# Patient Record
Sex: Male | Born: 1990 | Race: Black or African American | Hispanic: No | Smoking: Current every day smoker
Health system: Southern US, Community
[De-identification: ages and names within clinical notes are randomized; demographics above are authoritative.]

## PROBLEM LIST (undated history)

## (undated) HISTORY — PX: TOE SURGERY: SHX1073

---
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
## 2009-11-13 ENCOUNTER — Emergency Department (HOSPITAL_COMMUNITY): Admission: EM | Admit: 2009-11-13 | Discharge: 2009-11-13 | Payer: Self-pay | Admitting: Emergency Medicine

## 2010-12-14 LAB — GC/CHLAMYDIA PROBE AMP, GENITAL
Chlamydia, DNA Probe: POSITIVE — AB
GC Probe Amp, Genital: NEGATIVE

## 2011-05-24 ENCOUNTER — Encounter (HOSPITAL_COMMUNITY): Payer: Self-pay | Admitting: Emergency Medicine

## 2011-05-24 ENCOUNTER — Emergency Department (INDEPENDENT_AMBULATORY_CARE_PROVIDER_SITE_OTHER)
Admission: EM | Admit: 2011-05-24 | Discharge: 2011-05-24 | Disposition: A | Discharge status: Home / Self Care | Payer: Self-pay | Source: Home / Self Care | Attending: Family Medicine | Admitting: Family Medicine

## 2011-05-24 DIAGNOSIS — Z202 Contact with and (suspected) exposure to infections with a predominantly sexual mode of transmission: Secondary | ICD-10-CM

## 2011-05-24 DIAGNOSIS — Z9189 Other specified personal risk factors, not elsewhere classified: Secondary | ICD-10-CM

## 2011-05-24 NOTE — ED Provider Notes (Signed)
History     CSN: [id number]  Arrival date & time 05/24/11  4098   First MD Initiated Contact with Patient 05/24/11 [phone]      Chief Complaint  Patient presents with  . SEXUALLY TRANSMITTED DISEASE    (Consider location/radiation/quality/duration/timing/severity/associated sxs/prior treatment) HPI Comments: Patient reports having unprotected intercourse over 2 wks ago. States he has noted intermittent tingling ant there penile os. No discharge, no dysuria. No pain. No skin lesions or rash.   The history is provided by the patient.    History reviewed. No pertinent past medical history.  Past Surgical History  Procedure Date  . Toe surgery     History reviewed. No pertinent family history.  History  Substance Use Topics  . Smoking status: Current Everyday Smoker -- 0.5 packs/day  . Smokeless tobacco: Not on file  . Alcohol Use: Yes     occasional      Review of Systems  Constitutional: Negative.   HENT: Negative.   Respiratory: Negative.   Cardiovascular: Negative.   Gastrointestinal: Negative.   Genitourinary: Negative for dysuria, urgency, frequency, discharge, penile swelling and testicular pain.  Skin: Negative.     Allergies  Review of patient's allergies indicates no known allergies.  Home Medications  No current outpatient prescriptions on file.  BP 122/69  Pulse 72  Temp(Src) 98.5 F (36.9 C) (Oral)  Resp 14  SpO2 97%  Physical Exam  Nursing note and vitals reviewed. Constitutional: He appears well-developed and well-nourished.  Cardiovascular: Normal rate.   Pulmonary/Chest: Effort normal and breath sounds normal.  Genitourinary:       Normal male external gent. Circumcised. No pain , no discharge or tenderness. Sample collected.   Skin: Skin is warm and dry.    ED Course  Procedures (including critical care time)   Labs Reviewed  GC/CHLAMYDIA PROBE AMP, GENITAL   No results found.   1. Possible exposure to STD       MDM           Randa Spike, MD 05/24/11 1030

## 2011-05-24 NOTE — ED Notes (Signed)
Pt c/o tingling feeling in genitals X [redacted] weeks along with a constant discomfort. Pt was reluctant to go into further details about situation.

## 2011-05-24 NOTE — Discharge Instructions (Signed)
No intercourse x 10 days. We will contact you with any positive lab results and any new instructions if indicated.

## 2011-05-25 LAB — GC/CHLAMYDIA PROBE AMP, GENITAL
Chlamydia, DNA Probe: NEGATIVE
GC Probe Amp, Genital: NEGATIVE

## 2014-01-15 ENCOUNTER — Encounter (HOSPITAL_BASED_OUTPATIENT_CLINIC_OR_DEPARTMENT_OTHER): Payer: Self-pay | Admitting: *Deleted

## 2014-01-15 ENCOUNTER — Emergency Department (HOSPITAL_BASED_OUTPATIENT_CLINIC_OR_DEPARTMENT_OTHER): Payer: Self-pay

## 2014-01-15 ENCOUNTER — Emergency Department (HOSPITAL_BASED_OUTPATIENT_CLINIC_OR_DEPARTMENT_OTHER)
Admission: EM | Admit: 2014-01-15 | Discharge: 2014-01-15 | Disposition: A | Discharge status: Home / Self Care | Payer: Self-pay | Attending: Emergency Medicine | Admitting: Emergency Medicine

## 2014-01-15 DIAGNOSIS — Z720 Tobacco use: Secondary | ICD-10-CM | POA: Insufficient documentation

## 2014-01-15 DIAGNOSIS — J069 Acute upper respiratory infection, unspecified: Secondary | ICD-10-CM | POA: Insufficient documentation

## 2014-01-15 NOTE — Discharge Instructions (Signed)
YOu can take mucinex as discussed Upper Respiratory Infection, Adult An upper respiratory infection (URI) is also sometimes known as the common cold. The upper respiratory tract includes the nose, sinuses, throat, trachea, and bronchi. Bronchi are the airways leading to the lungs. Most people improve within 1 week, but symptoms can last up to 2 weeks. A residual cough may last even longer.  CAUSES Many different viruses can infect the tissues lining the upper respiratory tract. The tissues become irritated and inflamed and often become very moist. Mucus production is also common. A cold is contagious. You can easily spread the virus to others by oral contact. This includes kissing, sharing a glass, coughing, or sneezing. Touching your mouth or nose and then touching a surface, which is then touched by another person, can also spread the virus. SYMPTOMS  Symptoms typically develop 1 to 3 days after you come in contact with a cold virus. Symptoms vary from person to person. They may include:  Runny nose.  Sneezing.  Nasal congestion.  Sinus irritation.  Sore throat.  Loss of voice (laryngitis).  Cough.  Fatigue.  Muscle aches.  Loss of appetite.  Headache.  Low-grade fever. DIAGNOSIS  You might diagnose your own cold based on familiar symptoms, since most people get a cold 2 to 3 times a year. Your caregiver can confirm this based on your exam. Most importantly, your caregiver can check that your symptoms are not due to another disease such as strep throat, sinusitis, pneumonia, asthma, or epiglottitis. Blood tests, throat tests, and X-rays are not necessary to diagnose a common cold, but they may sometimes be helpful in excluding other more serious diseases. Your caregiver will decide if any further tests are required. RISKS AND COMPLICATIONS  You may be at risk for a more severe case of the common cold if you smoke cigarettes, have chronic heart disease (such as heart failure) or  lung disease (such as asthma), or if you have a weakened immune system. The very young and very old are also at risk for more serious infections. Bacterial sinusitis, middle ear infections, and bacterial pneumonia can complicate the common cold. The common cold can worsen asthma and chronic obstructive pulmonary disease (COPD). Sometimes, these complications can require emergency medical care and may be life-threatening. PREVENTION  The best way to protect against getting a cold is to practice good hygiene. Avoid oral or hand contact with people with cold symptoms. Wash your hands often if contact occurs. There is no clear evidence that vitamin C, vitamin E, echinacea, or exercise reduces the chance of developing a cold. However, it is always recommended to get plenty of rest and practice good nutrition. TREATMENT  Treatment is directed at relieving symptoms. There is no cure. Antibiotics are not effective, because the infection is caused by a virus, not by bacteria. Treatment may include:  Increased fluid intake. Sports drinks offer valuable electrolytes, sugars, and fluids.  Breathing heated mist or steam (vaporizer or shower).  Eating chicken soup or other clear broths, and maintaining good nutrition.  Getting plenty of rest.  Using gargles or lozenges for comfort.  Controlling fevers with ibuprofen or acetaminophen as directed by your caregiver.  Increasing usage of your inhaler if you have asthma. Zinc gel and zinc lozenges, taken in the first 24 hours of the common cold, can shorten the duration and lessen the severity of symptoms. Pain medicines may help with fever, muscle aches, and throat pain. A variety of non-prescription medicines are available to treat  congestion and runny nose. Your caregiver can make recommendations and may suggest nasal or lung inhalers for other symptoms.  HOME CARE INSTRUCTIONS   Only take over-the-counter or prescription medicines for pain, discomfort, or  fever as directed by your caregiver.  Use a warm mist humidifier or inhale steam from a shower to increase air moisture. This may keep secretions moist and make it easier to breathe.  Drink enough water and fluids to keep your urine clear or pale yellow.  Rest as needed.  Return to work when your temperature has returned to normal or as your caregiver advises. You may need to stay home longer to avoid infecting others. You can also use a face mask and careful hand washing to prevent spread of the virus. SEEK MEDICAL CARE IF:   After the first few days, you feel you are getting worse rather than better.  You need your caregiver's advice about medicines to control symptoms.  You develop chills, worsening shortness of breath, or brown or red sputum. These may be signs of pneumonia.  You develop yellow or brown nasal discharge or pain in the face, especially when you bend forward. These may be signs of sinusitis.  You develop a fever, swollen neck glands, pain with swallowing, or white areas in the back of your throat. These may be signs of strep throat. SEEK IMMEDIATE MEDICAL CARE IF:   You have a fever.  You develop severe or persistent headache, ear pain, sinus pain, or chest pain.  You develop wheezing, a prolonged cough, cough up blood, or have a change in your usual mucus (if you have chronic lung disease).  You develop sore muscles or a stiff neck. Document Released: 08/15/2000 Document Revised: 05/14/2011 Document Reviewed: 05/27/2013 [facility] Patient Information 2015 [place], [place]. This information is not intended to replace advice given to you by your health care provider. Make sure you discuss any questions you have with your health care provider.

## 2014-01-15 NOTE — ED Notes (Signed)
Pt c/o chest congestion and pro cough x 2 days

## 2014-01-15 NOTE — ED Provider Notes (Signed)
CSN: [id number]     Arrival date & time 01/15/14  1729 History   First MD Initiated Contact with Patient 01/15/14 1808     Chief Complaint  Patient presents with  . Sore Throat     (Consider location/radiation/quality/duration/timing/severity/associated sxs/prior Treatment) HPI Comments: Pt comes in today with productive cough and congestion times 2 weeks. No fever, vomiting, abdominal pain or dysuria. Hasn't taken anything for symptoms  The history is provided by the patient. No language interpreter was used.    History reviewed. No pertinent past medical history. Past Surgical History  Procedure Laterality Date  . Toe surgery     History reviewed. No pertinent family history. History  Substance Use Topics  . Smoking status: Current Every Day Smoker -- 0.50 packs/day  . Smokeless tobacco: Not on file  . Alcohol Use: Yes     Comment: occasional    Review of Systems  All other systems reviewed and are negative.     Allergies  Review of patient's allergies indicates no known allergies.  Home Medications   Prior to Admission medications   Not on File   BP 137/76 mmHg  Pulse 76  Temp(Src) 98.1 F (36.7 C) (Oral)  Resp 18  Ht 5\' 11"  (1.803 m)  Wt 200 lb (90.719 kg)  BMI 27.91 kg/m2  SpO2 99% Physical Exam  Constitutional: He is oriented to person, place, and time. He appears well-developed and well-nourished.  HENT:  Right Ear: External ear normal.  Left Ear: External ear normal.  Nose: Rhinorrhea present.  Mouth/Throat: No posterior oropharyngeal erythema.  Eyes: Conjunctivae and EOM are normal. Pupils are equal, round, and reactive to light.  Neck: Normal range of motion. Neck supple.  Cardiovascular: Normal rate and regular rhythm.   Pulmonary/Chest: Effort normal and breath sounds normal.  Abdominal: Soft. There is no tenderness.  Musculoskeletal: Normal range of motion.  Neurological: He is alert and oriented to person, place, and time.  Nursing note  and vitals reviewed.   ED Course  Procedures (including critical care time) Labs Review Labs Reviewed - No data to display  Imaging Review No results found.   EKG Interpretation None      MDM   Final diagnoses:  URI (upper respiratory infection)    Pt lungs clear:don't think imaging or antibiotics are needed at this time. Discussed use of otc medications with pt   Teressa LowerVrinda Ashleyanne Hemmingway, NP 01/15/14 1826  Warnell Foresterrey Wofford, MD 01/15/14 [id number]

## 2014-01-25 ENCOUNTER — Emergency Department (HOSPITAL_BASED_OUTPATIENT_CLINIC_OR_DEPARTMENT_OTHER)
Admission: EM | Admit: 2014-01-25 | Discharge: 2014-01-25 | Disposition: A | Discharge status: Home / Self Care | Payer: Self-pay | Attending: Emergency Medicine | Admitting: Emergency Medicine

## 2014-01-25 ENCOUNTER — Encounter (HOSPITAL_BASED_OUTPATIENT_CLINIC_OR_DEPARTMENT_OTHER): Payer: Self-pay | Admitting: *Deleted

## 2014-01-25 ENCOUNTER — Emergency Department (HOSPITAL_BASED_OUTPATIENT_CLINIC_OR_DEPARTMENT_OTHER): Payer: Self-pay

## 2014-01-25 DIAGNOSIS — Y929 Unspecified place or not applicable: Secondary | ICD-10-CM | POA: Insufficient documentation

## 2014-01-25 DIAGNOSIS — T1490XA Injury, unspecified, initial encounter: Secondary | ICD-10-CM

## 2014-01-25 DIAGNOSIS — M79642 Pain in left hand: Secondary | ICD-10-CM

## 2014-01-25 DIAGNOSIS — S6992XA Unspecified injury of left wrist, hand and finger(s), initial encounter: Secondary | ICD-10-CM | POA: Insufficient documentation

## 2014-01-25 DIAGNOSIS — Z23 Encounter for immunization: Secondary | ICD-10-CM | POA: Insufficient documentation

## 2014-01-25 DIAGNOSIS — W2201XA Walked into wall, initial encounter: Secondary | ICD-10-CM | POA: Insufficient documentation

## 2014-01-25 DIAGNOSIS — Y998 Other external cause status: Secondary | ICD-10-CM | POA: Insufficient documentation

## 2014-01-25 DIAGNOSIS — Z720 Tobacco use: Secondary | ICD-10-CM | POA: Insufficient documentation

## 2014-01-25 DIAGNOSIS — Y939 Activity, unspecified: Secondary | ICD-10-CM | POA: Insufficient documentation

## 2014-01-25 MED ORDER — NAPROXEN 500 MG PO TABS: 500.0000 mg | ORAL_TABLET | Freq: Two times a day (BID) | ORAL | Status: DC | PRN

## 2014-01-25 MED ORDER — TETANUS-DIPHTH-ACELL PERTUSSIS 5-2.5-18.5 LF-MCG/0.5 IM SUSP
0.5000 mL | Freq: Once | INTRAMUSCULAR | Status: AC
  Administered 2014-01-25: 0.5 mL via INTRAMUSCULAR
  Filled 2014-01-25: qty 0.5

## 2014-01-25 MED ORDER — HYDROCODONE-ACETAMINOPHEN 5-325 MG PO TABS
1.0000 | ORAL_TABLET | Freq: Once | ORAL | Status: AC
  Administered 2014-01-25: 1 via ORAL
  Filled 2014-01-25: qty 1

## 2014-01-25 MED ORDER — HYDROCODONE-ACETAMINOPHEN 5-325 MG PO TABS: 1.0000 | ORAL_TABLET | Freq: Four times a day (QID) | ORAL | Status: DC | PRN

## 2014-01-25 MED ORDER — BACITRACIN 500 UNIT/GM EX OINT
1.0000 "application " | TOPICAL_OINTMENT | Freq: Two times a day (BID) | CUTANEOUS | Status: DC
  Filled 2014-01-25: qty 0.9

## 2014-01-25 NOTE — Discharge Instructions (Signed)
Please follow up with your primary care physician in 1-2 days. If you do not have one please call the [facility] listed above. Please follow up with Dr. Pearletha ForgeHudnall to schedule a follow up appointment.  Please take pain medication and/or muscle relaxants as prescribed and as needed for pain. Please do not drive on narcotic pain medication or on muscle relaxants. Please follow RICE method below. Please read all discharge instructions and return precautions.   Hand Contusion A hand contusion is a deep bruise on your hand area. Contusions are the result of an injury that caused bleeding under the skin. The contusion may turn blue, purple, or yellow. Minor injuries will give you a painless contusion, but more severe contusions may stay painful and swollen for a few weeks. CAUSES  A contusion is usually caused by a blow, trauma, or direct force to an area of the body. SYMPTOMS   Swelling and redness of the injured area.  Discoloration of the injured area.  Tenderness and soreness of the injured area.  Pain. DIAGNOSIS  The diagnosis can be made by taking a history and performing a physical exam. An X-ray, CT scan, or MRI may be needed to determine if there were any associated injuries, such as broken bones (fractures). TREATMENT  Often, the best treatment for a hand contusion is resting, elevating, icing, and applying cold compresses to the injured area. Over-the-counter medicines may also be recommended for pain control. HOME CARE INSTRUCTIONS   Put ice on the injured area.  Put ice in a plastic bag.  Place a towel between your skin and the bag.  Leave the ice on for 15-20 minutes, 03-04 times a day.  Only take over-the-counter or prescription medicines as directed by your caregiver. Your caregiver may recommend avoiding anti-inflammatory medicines (aspirin, ibuprofen, and naproxen) for 48 hours because these medicines may increase bruising.  If told, use an elastic  wrap as directed. This can help reduce swelling. You may remove the wrap for sleeping, showering, and bathing. If your fingers become numb, cold, or blue, take the wrap off and reapply it more loosely.  Elevate your hand with pillows to reduce swelling.  Avoid overusing your hand if it is painful. SEEK IMMEDIATE MEDICAL CARE IF:   You have increased redness, swelling, or pain in your hand.  Your swelling or pain is not relieved with medicines.  You have loss of feeling in your hand or are unable to move your fingers.  Your hand turns cold or blue.  You have pain when you move your fingers.  Your hand becomes warm to the touch.  Your contusion does not improve in 2 days. MAKE SURE YOU:   Understand these instructions.  Will watch your condition.  Will get help right away if you are not doing well or get worse. Document Released: 08/11/2001 Document Revised: 11/14/2011 Document Reviewed: 08/13/2011 [facility] Patient Information 2015 [place], [place]. This information is not intended to replace advice given to you by your health care provider. Make sure you discuss any questions you have with your health care provider.  RICE: Routine Care for Injuries The routine care of many injuries includes Rest, Ice, Compression, and Elevation (RICE). HOME CARE INSTRUCTIONS  Rest is needed to allow your body to heal. Routine activities can usually be resumed when comfortable. Injured tendons and bones can take up to 6 weeks to heal. Tendons are the cord-like structures that attach muscle to bone.  Ice following an injury helps  keep the swelling down and reduces pain.  Put ice in a plastic bag.  Place a towel between your skin and the bag.  Leave the ice on for 15-20 minutes, 3-4 times a day, or as directed by your health care provider. Do this while awake, for the first 24 to 48 hours. After that, continue as directed by your caregiver.  Compression helps keep swelling down. It also gives  support and helps with discomfort. If an elastic bandage has been applied, it should be removed and reapplied every 3 to 4 hours. It should not be applied tightly, but firmly enough to keep swelling down. Watch fingers or toes for swelling, bluish discoloration, coldness, numbness, or excessive pain. If any of these problems occur, remove the bandage and reapply loosely. Contact your caregiver if these problems continue.  Elevation helps reduce swelling and decreases pain. With extremities, such as the arms, hands, legs, and feet, the injured area should be placed near or above the level of the heart, if possible. SEEK IMMEDIATE MEDICAL CARE IF:  You have persistent pain and swelling.  You develop redness, numbness, or unexpected weakness.  Your symptoms are getting worse rather than improving after several days. These symptoms may indicate that further evaluation or further X-rays are needed. Sometimes, X-rays may not show a small broken bone (fracture) until 1 week or 10 days later. Make a follow-up appointment with your caregiver. Ask when your X-ray results will be ready. Make sure you get your X-ray results. Document Released: 06/03/2000 Document Revised: 02/24/2013 Document Reviewed: 07/21/2010 [facility] CenterExitCare Patient Information 2015 [place]ExitCare, [place]LL[place]. This information is not intended to replace advice given to you by your health care provider. Make sure you discuss any questions you have with your health care provider.

## 2014-01-25 NOTE — ED Notes (Signed)
Pt discharged to home with family. NAD.  

## 2014-01-25 NOTE — ED Provider Notes (Signed)
CSN: [id number]     Arrival date & time 01/25/14  1613 History   First MD Initiated Contact with Patient 01/25/14 1635     Chief Complaint  Patient presents with  . Hand Injury     (Consider location/radiation/quality/duration/timing/severity/associated sxs/prior Treatment) HPI Comments: Patient is an 23 year old male past medical history significant for tobacco abuse presenting to the emergency department for left hand pain. He states last evening he hit his hand on the wall. This complaining of moderate sore achy pain to the hand without radiation. Alleviating factors: ice. Aggravating factors: movement, palpation. Medications tried prior to arrival: none. No history of left hand injuries in the past. Denies any numbness or tingling. He is right-hand dominant.    Patient is a 23 y.o. male presenting with hand injury.  Hand Injury   History reviewed. No pertinent past medical history. Past Surgical History  Procedure Laterality Date  . Toe surgery     No family history on file. History  Substance Use Topics  . Smoking status: Current Every Day Smoker -- 0.50 packs/day  . Smokeless tobacco: Not on file  . Alcohol Use: Yes     Comment: occasional    Review of Systems  Musculoskeletal: Positive for myalgias and arthralgias.  Skin: Positive for wound.  All other systems reviewed and are negative.     Allergies  Review of patient's allergies indicates no known allergies.  Home Medications   Prior to Admission medications   Not on File   BP 146/66 mmHg  Pulse 94  Temp(Src) 98.3 F (36.8 C) (Oral)  Resp 16  Ht 5\' 11"  (1.803 m)  Wt 200 lb (90.719 kg)  BMI 27.91 kg/m2  SpO2 100% Physical Exam  Constitutional: He is oriented to person, place, and time. He appears well-developed and well-nourished. No distress.  HENT:  Head: Normocephalic and atraumatic.  Right Ear: External ear normal.  Left Ear: External ear normal.  Nose: Nose normal.  Mouth/Throat:  Oropharynx is clear and moist.  Eyes: Conjunctivae are normal.  Neck: Normal range of motion. Neck supple.  Cardiovascular: Normal rate, regular rhythm, normal heart sounds and intact distal pulses.   Pulmonary/Chest: Effort normal and breath sounds normal.  Abdominal: Soft.  Musculoskeletal: Normal range of motion.       Right wrist: Normal.       Left wrist: Normal.       Right forearm: Normal.       Right hand: He exhibits tenderness. He exhibits normal range of motion, no bony tenderness, normal capillary refill, no deformity and no swelling. Normal sensation noted. Normal strength noted.       Left hand: Normal.       Hands: Neurological: He is alert and oriented to person, place, and time.  Skin: Skin is warm and dry. He is not diaphoretic.  Psychiatric: He has a normal mood and affect.  Nursing note and vitals reviewed.   ED Course  Procedures (including critical care time) Medications  bacitracin ointment 1 application (not administered)  Tdap (BOOSTRIX) injection 0.5 mL (0.5 mLs Intramuscular Given 01/25/14 1720)  HYDROcodone-acetaminophen (NORCO/VICODIN) 5-325 MG per tablet 1 tablet (1 tablet Oral Given 01/25/14 1723)    Labs Review Labs Reviewed - No data to display  Imaging Review Dg Hand Complete Left  01/25/2014   CLINICAL DATA:  Hit something last night with his LEFT hand, posterior pain since, blunt trauma, initial encounter  EXAM: LEFT HAND - COMPLETE 3+ VIEW  COMPARISON:  None  FINDINGS: Osseous mineralization normal.  Joint spaces preserved.  No fracture, dislocation, or bone destruction.  IMPRESSION: Normal exam.   Electronically Signed   By: Ulyses SouthwardMark  Boles M.D.   On: 01/25/2014 16:34     EKG Interpretation None      MDM   Final diagnoses:  Left hand pain    Filed Vitals:   01/25/14 1618  BP: 146/66  Pulse: 94  Temp: 98.3 F (36.8 C)  Resp: 16   Afebrile, NAD, non-toxic appearing, AAOx4.  Neurovascularly intact. Normal sensation. No evidence of  compartment syndrome. Patient X-Ray negative for obvious fracture or dislocation. Tdap booster updated given abrasions. Pain managed in ED. Pt advised to follow up with orthopedics if symptoms persist for possibility of missed fracture diagnosis. Conservative therapy recommended and discussed. Patient will be dc home & is agreeable with above plan. Patient is stable at time of discharge      Jeannetta EllisJennifer L Desman Polak, PA-C 01/25/14 1846  Doug SouSam Jacubowitz, MD 01/25/14 2223

## 2014-01-25 NOTE — ED Notes (Signed)
Left hand injury last night. He hit something. Swelling noted.

## 2014-01-31 ENCOUNTER — Encounter (HOSPITAL_COMMUNITY): Payer: Self-pay | Admitting: Emergency Medicine

## 2014-01-31 ENCOUNTER — Emergency Department (HOSPITAL_COMMUNITY)
Admission: EM | Admit: 2014-01-31 | Discharge: 2014-02-01 | Disposition: A | Discharge status: Home / Self Care | Payer: Self-pay | Attending: Emergency Medicine | Admitting: Emergency Medicine

## 2014-01-31 DIAGNOSIS — F430 Acute stress reaction: Secondary | ICD-10-CM

## 2014-01-31 DIAGNOSIS — Z720 Tobacco use: Secondary | ICD-10-CM | POA: Insufficient documentation

## 2014-01-31 DIAGNOSIS — F4325 Adjustment disorder with mixed disturbance of emotions and conduct: Secondary | ICD-10-CM | POA: Insufficient documentation

## 2014-01-31 DIAGNOSIS — R454 Irritability and anger: Secondary | ICD-10-CM

## 2014-01-31 DIAGNOSIS — F1210 Cannabis abuse, uncomplicated: Secondary | ICD-10-CM | POA: Insufficient documentation

## 2014-01-31 LAB — CBC WITH DIFFERENTIAL/PLATELET
Basophils Absolute: 0 10*3/uL (ref 0.0–0.1)
Basophils Relative: 0 % (ref 0–1)
Eosinophils Absolute: 0.1 10*3/uL (ref 0.0–0.7)
Eosinophils Relative: 2 % (ref 0–5)
HCT: 41.5 % (ref 39.0–52.0)
Hemoglobin: 14.4 g/dL (ref 13.0–17.0)
Lymphocytes Relative: 22 % (ref 12–46)
Lymphs Abs: 2.1 10*3/uL (ref 0.7–4.0)
MCH: 28.1 pg (ref 26.0–34.0)
MCHC: 34.7 g/dL (ref 30.0–36.0)
MCV: 81.1 fL (ref 78.0–100.0)
Monocytes Absolute: 0.7 10*3/uL (ref 0.1–1.0)
Monocytes Relative: 7 % (ref 3–12)
Neutro Abs: 6.7 10*3/uL (ref 1.7–7.7)
Neutrophils Relative %: 69 % (ref 43–77)
Platelets: 228 10*3/uL (ref 150–400)
RBC: 5.12 MIL/uL (ref 4.22–5.81)
RDW: 14.7 % (ref 11.5–15.5)
WBC: 9.7 10*3/uL (ref 4.0–10.5)

## 2014-01-31 LAB — COMPREHENSIVE METABOLIC PANEL
ALT: 19 U/L (ref 0–53)
AST: 21 U/L (ref 0–37)
Albumin: 4.4 g/dL (ref 3.5–5.2)
Alkaline Phosphatase: 51 U/L (ref 39–117)
Anion gap: 17 — ABNORMAL HIGH (ref 5–15)
BUN: 13 mg/dL (ref 6–23)
CO2: 21 mEq/L (ref 19–32)
Calcium: 9.3 mg/dL (ref 8.4–10.5)
Chloride: 106 mEq/L (ref 96–112)
Creatinine, Ser: 1.03 mg/dL (ref 0.50–1.35)
GFR calc Af Amer: >90 mL/min (ref >90)
GFR calc non Af Amer: >90 mL/min (ref >90)
Glucose, Bld: 106 mg/dL — ABNORMAL HIGH (ref 70–99)
Potassium: 4 mEq/L (ref 3.7–5.3)
Sodium: 144 mEq/L (ref 137–147)
Total Bilirubin: 0.5 mg/dL (ref 0.3–1.2)
Total Protein: 7.8 g/dL (ref 6.0–8.3)

## 2014-01-31 LAB — RAPID URINE DRUG SCREEN, HOSP PERFORMED
Amphetamines: NOT DETECTED
Barbiturates: NOT DETECTED
Benzodiazepines: NOT DETECTED
Cocaine: NOT DETECTED
Opiates: NOT DETECTED
Tetrahydrocannabinol: POSITIVE — AB

## 2014-01-31 LAB — SALICYLATE LEVEL: Salicylate Lvl: <2 mg/dL — ABNORMAL LOW (ref 2.8–20.0)

## 2014-01-31 LAB — ETHANOL: Alcohol, Ethyl (B): 109 mg/dL — ABNORMAL HIGH (ref 0–11)

## 2014-01-31 LAB — ACETAMINOPHEN LEVEL: Acetaminophen (Tylenol), Serum: <15 ug/mL (ref 10–30)

## 2014-01-31 MED ORDER — ACETAMINOPHEN 325 MG PO TABS: 650.0000 mg | ORAL_TABLET | ORAL | Status: DC | PRN

## 2014-01-31 MED ORDER — LORAZEPAM 1 MG PO TABS
1.0000 mg | ORAL_TABLET | Freq: Three times a day (TID) | ORAL | Status: DC | PRN
  Administered 2014-01-31: 1 mg via ORAL
  Filled 2014-01-31: qty 1

## 2014-01-31 MED ORDER — ONDANSETRON HCL 4 MG PO TABS: 4.0000 mg | ORAL_TABLET | Freq: Three times a day (TID) | ORAL | Status: DC | PRN

## 2014-01-31 NOTE — ED Provider Notes (Addendum)
CSN: [id number]     Arrival date & time 01/31/14  1005 History   First MD Initiated Contact with Patient 01/31/14 1026     Chief Complaint  Patient presents with  . Medical Clearance     (Consider location/radiation/quality/duration/timing/severity/associated sxs/prior Treatment) Patient is a 23 y.o. male presenting with mental health disorder. The history is provided by the patient. No language interpreter was used.  Mental Health Problem Presenting symptoms: aggressive behavior, agitation, homicidal ideas and suicidal thoughts   Patient accompanied by:  Friend Degree of incapacity (severity):  Moderate Onset quality:  Gradual Timing:  Constant Progression:  Worsening Chronicity:  New Context: drug abuse   Treatment compliance:  Untreated Relieved by:  Nothing Worsened by:  Nothing tried Ineffective treatments:  None tried Risk factors: no family hx of mental illness   Pt reports she wakes up angry every day.  Pt reports he wants to hurt someone.  Pt admits to thoughts of self harm.  No plan.Pt thinks there must be something wrong with him   History reviewed. No pertinent past medical history. Past Surgical History  Procedure Laterality Date  . Toe surgery     No family history on file. History  Substance Use Topics  . Smoking status: Current Every Day Smoker -- 0.50 packs/day  . Smokeless tobacco: Not on file  . Alcohol Use: Yes     Comment: occasional    Review of Systems  Psychiatric/Behavioral: Positive for suicidal ideas, homicidal ideas and agitation.  All other systems reviewed and are negative.     Allergies  Review of patient's allergies indicates no known allergies.  Home Medications   Prior to Admission medications   Medication Sig Start Date End Date Taking? Authorizing Provider  HYDROcodone-acetaminophen (NORCO/VICODIN) 5-325 MG per tablet Take 1-2 tablets by mouth every 6 (six) hours as needed for severe pain. Patient taking differently: Take 1  tablet by mouth every 6 (six) hours as needed for severe pain.  01/25/14  Yes Jennifer L Piepenbrink, PA-C  naproxen (NAPROSYN) 500 MG tablet Take 1 tablet (500 mg total) by mouth 2 (two) times daily as needed for mild pain or moderate pain. 01/25/14  Yes Jennifer L Piepenbrink, PA-C  Phenylephrine-DM-GG-APAP (MUCINEX FAST-MAX) 5-10-200-325 MG CAPS Take 1 capsule by mouth every 6 (six) hours as needed (for cold).   Yes Historical Provider, MD  tetanus immune globulin (HYPERTET) 250 UNIT/ML injection Inject 250 Units into the muscle once.   Yes Historical Provider, MD   BP 142/92 mmHg  Pulse 82  Temp(Src) 98.1 F (36.7 C) (Oral)  Resp 20  SpO2 100% Physical Exam  Constitutional: He is oriented to person, place, and time. He appears well-developed and well-nourished.  HENT:  Head: Normocephalic and atraumatic.  Right Ear: External ear normal.  Left Ear: External ear normal.  Eyes: Conjunctivae and EOM are normal. Pupils are equal, round, and reactive to light.  Neck: Normal range of motion.  Cardiovascular: Normal rate, regular rhythm and normal heart sounds.   Pulmonary/Chest: Effort normal.  Abdominal: Soft. He exhibits no distension.  Musculoskeletal: Normal range of motion.  Neurological: He is alert and oriented to person, place, and time.  Skin: Skin is warm.  Psychiatric: He has a normal mood and affect.  Nursing note and vitals reviewed.   ED Course  Procedures (including critical care time) Labs Review Labs Reviewed  COMPREHENSIVE METABOLIC PANEL - Abnormal; Notable for the following:    Glucose, Bld 106 (*)    Anion gap 17 (*)  All other components within normal limits  ETHANOL - Abnormal; Notable for the following:    Alcohol, Ethyl (B) 109 (*)    All other components within normal limits  SALICYLATE LEVEL - Abnormal; Notable for the following:    Salicylate Lvl <2.0 (*)    All other components within normal limits  CBC WITH DIFFERENTIAL  ACETAMINOPHEN LEVEL   URINE RAPID DRUG SCREEN (HOSP PERFORMED)    Imaging Review No results found.   EKG Interpretation None      Results for orders placed or performed during the hospital encounter of 01/31/14  CBC WITH DIFFERENTIAL  Result Value Ref Range   WBC 9.7 4.0 - 10.5 K/uL   RBC 5.12 4.22 - 5.81 MIL/uL   Hemoglobin 14.4 13.0 - 17.0 g/dL   HCT 57.8 46.9 - 62.9 %   MCV 81.1 78.0 - 100.0 fL   MCH 28.1 26.0 - 34.0 pg   MCHC 34.7 30.0 - 36.0 g/dL   RDW 52.8 41.3 - 24.4 %   Platelets 228 150 - 400 K/uL   Neutrophils Relative % 69 43 - 77 %   Neutro Abs 6.7 1.7 - 7.7 K/uL   Lymphocytes Relative 22 12 - 46 %   Lymphs Abs 2.1 0.7 - 4.0 K/uL   Monocytes Relative 7 3 - 12 %   Monocytes Absolute 0.7 0.1 - 1.0 K/uL   Eosinophils Relative 2 0 - 5 %   Eosinophils Absolute 0.1 0.0 - 0.7 K/uL   Basophils Relative 0 0 - 1 %   Basophils Absolute 0.0 0.0 - 0.1 K/uL  Comprehensive metabolic panel  Result Value Ref Range   Sodium 144 137 - 147 mEq/L   Potassium 4.0 3.7 - 5.3 mEq/L   Chloride 106 96 - 112 mEq/L   CO2 21 19 - 32 mEq/L   Glucose, Bld 106 (H) 70 - 99 mg/dL   BUN 13 6 - 23 mg/dL   Creatinine, Ser 0.10 0.50 - 1.35 mg/dL   Calcium 9.3 8.4 - 27.2 mg/dL   Total Protein 7.8 6.0 - 8.3 g/dL   Albumin 4.4 3.5 - 5.2 g/dL   AST 21 0 - 37 U/L   ALT 19 0 - 53 U/L   Alkaline Phosphatase 51 39 - 117 U/L   Total Bilirubin 0.5 0.3 - 1.2 mg/dL   GFR calc non Af Amer >90 >90 mL/min   GFR calc Af Amer >90 >90 mL/min   Anion gap 17 (H) 5 - 15  Drug screen panel, emergency  Result Value Ref Range   Opiates NONE DETECTED NONE DETECTED   Cocaine NONE DETECTED NONE DETECTED   Benzodiazepines NONE DETECTED NONE DETECTED   Amphetamines NONE DETECTED NONE DETECTED   Tetrahydrocannabinol POSITIVE (A) NONE DETECTED   Barbiturates NONE DETECTED NONE DETECTED  Ethanol  Result Value Ref Range   Alcohol, Ethyl (B) 109 (H) 0 - 11 mg/dL  Acetaminophen level  Result Value Ref Range   Acetaminophen (Tylenol),  Serum <15.0 10 - 30 ug/mL  Salicylate level  Result Value Ref Range   Salicylate Lvl <2.0 (L) 2.8 - 20.0 mg/dL   Dg Hand Complete Left  01/25/2014   CLINICAL DATA:  Hit something last night with his LEFT hand, posterior pain since, blunt trauma, initial encounter  EXAM: LEFT HAND - COMPLETE 3+ VIEW  COMPARISON:  None  FINDINGS: Osseous mineralization normal.  Joint spaces preserved.  No fracture, dislocation, or bone destruction.  IMPRESSION: Normal exam.   Electronically Signed  By: Ulyses SouthwardMark  Boles M.D.   On: 01/25/2014 16:34     MDM   Final diagnoses:  Difficulty controlling anger  Depression    Pt moved to psch hold unit    Elson AreasLeslie K Sofia, PA-C 01/31/14 1206  Nelia Shiobert L Beaton, MD 01/31/14 [address]1515  Leslie K HollygroveSofia, PA-C 01/31/14 1542  Nelia Shiobert L Beaton, MD 02/01/14 [phone]

## 2014-01-31 NOTE — ED Notes (Addendum)
Pt from home c/o waking up every morning angry and " wanting to hurt someone". Patient states "I don't know sometimes" when asked if wants to hurt himself. Pt tearful during assessment. Pt has  Abrasions to knuckles from punching stuff.

## 2014-01-31 NOTE — BH Assessment (Signed)
[facility] Assessment Progress Note  Spoke with Clydie BraunKaren, [place] and took history.  Rn in [place] will put machine in the room for tele assessment.

## 2014-01-31 NOTE — ED Notes (Signed)
Jasmine [phone]  Work is [phone]

## 2014-01-31 NOTE — ED Notes (Signed)
Pt states he can not void at this time.  

## 2014-01-31 NOTE — ED Notes (Signed)
Nursing admission note: pt came to this institution voluntarily stating that he has intermittent HI. He stated he wakes up angry and during one of his episode he hit a wall and both of his hands are swollen and they have abrasions. During the nursing assessment he began to cry.  He denies any SI and stated that he has some auditory hallucinations. He was unclear of the details of these auditory hallucinations. He has no medical problem and denied any pain presently. He stated he smokes mj and cigarettes, but did not want a nicotine patch for cigarette smoking. He drinks etoh sometimes and stated he had 4 to 5 shots of "moonshine" last night and smoked mj. He is in a relationship with a woman and stated he is happy with her. He has never been in a psych hospital before. He denies any physical, sexual or emotional abuse in his life. He denied any allergies and has an 11 th grade education.RN will monitor and Q 15 min ck's continue.
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
## 2014-01-31 NOTE — ED Notes (Signed)
Report received from Brooks RN. Pt. Alert and oriented in no distress denies SI, HI, AVH and pain. Will continue to monitor for safety. Pt. Instructed to come to me with problems or concerns. Q 15 minute checks continue. 

## 2014-02-01 DIAGNOSIS — F430 Acute stress reaction: Secondary | ICD-10-CM | POA: Insufficient documentation

## 2014-02-01 DIAGNOSIS — R454 Irritability and anger: Secondary | ICD-10-CM | POA: Insufficient documentation
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
## 2014-02-01 NOTE — BHH Suicide Risk Assessment (Signed)
Suicide Risk Assessment  Discharge Assessment     Demographic Factors:  Male and Adolescent or young adult  Total Time spent with patient: 45 minutes  Psychiatric Specialty Exam:     Blood pressure 146/93, pulse 72, temperature 98.4 F (36.9 C), temperature source Oral, resp. rate 12, SpO2 100 %.There is no weight on file to calculate BMI.  General Appearance: Casual  Eye Contact::  Good  Speech:  Normal Rate  Volume:  Normal  Mood:  Euthymic  Affect:  Congruent  Thought Process:  Coherent  Orientation:  Full (Time, Place, and Person)  Thought Content:  WDL  Suicidal Thoughts:  No  Homicidal Thoughts:  No  Memory:  Good  Judgement:  Fair  Insight:  Good  Psychomotor Activity:  Normal  Concentration:  Good  Recall:  Good  Fund of Knowledge:Good  Language: Good  Akathisia:  No  Handed:  Right  AIMS (if indicated):     Assets:  Housing Intimacy Leisure Time Physical Health Resilience Social Support  Sleep:       Musculoskeletal: Strength & Muscle Tone: within normal limits Gait & Station: normal Patient leans: N/A   Mental Status Per Nursing Assessment::   On Admission:   anger   Current Mental Status by Physician: NA  Loss Factors: NA  Historical Factors: NA  Risk Reduction Factors:   Sense of responsibility to family, Living with another person, especially a relative and Positive social support  Continued Clinical Symptoms:  None  Cognitive Features That Contribute To Risk:  None   Suicide Risk:  Minimal: No identifiable suicidal ideation.  Patients presenting with no risk factors but with morbid ruminations; may be classified as minimal risk based on the severity of the depressive symptoms  Discharge Diagnoses:   AXIS I:  Acute stress reaction causing mixed disturbance of emotion and conduct; difficulty controlling his anger AXIS II:  Deferred AXIS III:  History reviewed. No pertinent past medical history. AXIS IV:  other psychosocial or  environmental problems and problems related to social environment AXIS V:  61-70 mild symptoms  Plan Of Care/Follow-up recommendations:  Activity:  as tolerated Diet:  heart healthy diet  Is patient on multiple antipsychotic therapies at discharge:  No   Has Patient had three or more failed trials of antipsychotic monotherapy by history:  No  Recommended Plan for Multiple Antipsychotic Therapies: NA    Kona Lover, PMH-NP 02/01/2014, 7:21 PM

## 2014-02-01 NOTE — ED Notes (Signed)
Patient discharged to home.  Denies thoughts of self harm or thoughts of wanting to hurt others.  States he is not hearing voices today.  Has been cooperative throughout the day.  Appetite has been good.  All belongings returned.  Left the unit ambulatory with girlfriend.

## 2014-02-01 NOTE — BH Assessment (Signed)
BHH Assessment Progress Note    The following Adult psych facilities were contacted in an attempt to place the pt:  Referral faxed for review: [facility] (beds available per Claris CheMargaret) [facility] (beds available per Victorino DikeJennifer)  At capacity:  [facility] (at capacity per Kipp BroodBrent) [facility] (at capacity per Alcario DroughtErica) [facility] (at capacity per Christiane HaJonathan) [facility] (at capacity per Sissy) [facility] (at capacity per Dennie BiblePat) [facility] (at capacity per Gunnar FusiPaula) [facility] (at capacity per Cala BradfordKimberly)  Not taking SA patients: [facility]  [facility]  No answer: [facility]    TTS will continue to seek placement for the pt.  Beryle FlockMary Verdell Kincannon, MS, CRC, [facility] Licensed Professional Counselor Therapeutic Triage Specialist [facility] Phone: [phone] Fax: [phone]

## 2014-02-01 NOTE — BH Assessment (Signed)
BHH Assessment Progress Note  This Clinical research associatewriter was asked to provide pt with referral information for an area anger management program.  Pt was given written information for the following provider:       [facility]      [address]      [place], [place] [address]      [phone]  Stephen Canninghomas Maleeah Crossman, MA Triage Specialist 02/01/2014 @ 15:44

## 2014-04-27 ENCOUNTER — Encounter (HOSPITAL_BASED_OUTPATIENT_CLINIC_OR_DEPARTMENT_OTHER): Payer: Self-pay

## 2014-04-27 ENCOUNTER — Emergency Department (HOSPITAL_BASED_OUTPATIENT_CLINIC_OR_DEPARTMENT_OTHER)
Admission: EM | Admit: 2014-04-27 | Discharge: 2014-04-27 | Disposition: A | Discharge status: Home / Self Care | Payer: Self-pay | Attending: Emergency Medicine | Admitting: Emergency Medicine

## 2014-04-27 DIAGNOSIS — M791 Myalgia, unspecified site: Secondary | ICD-10-CM

## 2014-04-27 DIAGNOSIS — Z720 Tobacco use: Secondary | ICD-10-CM | POA: Insufficient documentation

## 2014-04-27 MED ORDER — METHOCARBAMOL 500 MG PO TABS: 500.0000 mg | ORAL_TABLET | Freq: Two times a day (BID) | ORAL | Status: DC

## 2014-04-27 MED ORDER — TRAMADOL HCL 50 MG PO TABS: 50.0000 mg | ORAL_TABLET | Freq: Four times a day (QID) | ORAL | Status: DC | PRN

## 2014-04-27 MED ORDER — NAPROXEN 500 MG PO TABS
500.0000 mg | ORAL_TABLET | Freq: Two times a day (BID) | ORAL | Status: DC
Start: 2014-04-27 — End: 2015-01-28

## 2014-04-27 NOTE — ED Provider Notes (Signed)
CSN: [id number]     Arrival date & time 04/27/14  2042 History  This chart was scribed for Rolland PorterMark Peggy Monk, MD by Chestine SporeSoijett Blue, ED Scribe. The patient was seen in room MH10/MH10 at 9:32 PM.     Chief Complaint  Patient presents with  . Neck Pain  . Chest Pain      The history is provided by the patient. No language interpreter was used.    HPI Comments: Monico BlitzBradley Schellenberg is a 24 y.o. male who presents to the Emergency Department complaining of neck pain onset yesterday morning. When the pt tried to lift up, there was pain from his neck down his back. when the pt looked to the right there was pain in his chest. Pt denies injury. 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 There is no pain when the pt takes a deep breathe. This is the second episode that the pt has had. He states that he is having associated symptoms of upper back pain, CP. He denies fever, cough, SOB, and any other symptoms. Pt is a smoker. Pt is otherwise healthy.    History reviewed. No pertinent past medical history. Past Surgical History  Procedure Laterality Date  . Toe surgery     History reviewed. No pertinent family history. History  Substance Use Topics  . Smoking status: Current Every Day Smoker -- 0.50 packs/day  . Smokeless tobacco: Not on file  . Alcohol Use: Yes     Comment: occasional    Review of Systems  Constitutional: Negative for fever, chills, diaphoresis, appetite change and fatigue.  HENT: Negative for mouth sores, sore throat and trouble swallowing.   Eyes: Negative for visual disturbance.  Respiratory: Negative for cough, chest tightness, shortness of breath and wheezing.   Cardiovascular: Positive for chest pain.  Gastrointestinal: Negative for nausea, vomiting, abdominal pain, diarrhea and abdominal distention.  Endocrine: Negative for polydipsia, polyphagia and polyuria.  Genitourinary: Negative for dysuria, frequency and hematuria.  Musculoskeletal: Positive for  back pain and neck pain. Negative for gait problem.  Skin: Negative for color change, pallor and rash.  Neurological: Negative for dizziness, syncope, light-headedness and headaches.  Hematological: Does not bruise/bleed easily.  Psychiatric/Behavioral: Negative for behavioral problems and confusion.      Allergies  Review of patient's allergies indicates no known allergies.  Home Medications   Prior to Admission medications   Medication Sig Start Date End Date Taking? Authorizing Provider  methocarbamol (ROBAXIN) 500 MG tablet Take 1 tablet (500 mg total) by mouth 2 (two) times daily. 04/27/14   Rolland PorterMark Macrina Lehnert, MD  naproxen (NAPROSYN) 500 MG tablet Take 1 tablet (500 mg total) by mouth 2 (two) times daily. 04/27/14   Rolland PorterMark Miah Boye, MD  traMADol (ULTRAM) 50 MG tablet Take 1 tablet (50 mg total) by mouth every 6 (six) hours as needed. 04/27/14   Rolland PorterMark Rohin Krejci, MD   BP 137/71 mmHg  Pulse 72  Temp(Src) 98.1 F (36.7 C) (Oral)  Resp 20  Ht 5\' 11"  (1.803 m)  Wt 202 lb (91.627 kg)  BMI 28.19 kg/m2  SpO2 99%  Physical Exam  Constitutional: He is oriented to person, place, and time. He appears well-developed and well-nourished. No distress.  HENT:  Head: Normocephalic.  Eyes: Conjunctivae are normal. Pupils are equal, round, and reactive to light. No scleral icterus.  Neck: Normal range of motion. Neck supple. No thyromegaly present.  Cardiovascular: Normal rate, regular rhythm and normal heart sounds.  Exam reveals no gallop and  no friction rub.   No murmur heard. Pulmonary/Chest: Effort normal and breath sounds normal. No respiratory distress. He has no wheezes. He has no rales.  Abdominal: Soft. Bowel sounds are normal. He exhibits no distension. There is no tenderness. There is no rebound.  Musculoskeletal: Normal range of motion.  Paraspinal tenderness starting at the level of the scalpula and down.   Neurological: He is alert and oriented to person, place, and time.  Skin: Skin is warm  and dry. No rash noted.  Psychiatric: He has a normal mood and affect. His behavior is normal.  Nursing note and vitals reviewed.   ED Course  Procedures (including critical care time) DIAGNOSTIC STUDIES: Oxygen Saturation is 99% on RA, normal by my interpretation.    COORDINATION OF CARE: 9:35 PM-Discussed treatment plan which includes EKG, anti-inflammatory, muscle relaxer with pt at bedside and pt agreed to plan.   Labs Review Labs Reviewed - No data to display  Imaging Review No results found.   EKG Interpretation None      MDM   Final diagnoses:  Muscular pain    Clearly reproducible pain.  Normal exam other than tenderness.  Normal EKG. Not pleuretic.  Plan is Tx for Muscuular pain.  I personally performed the services described in this documentation, which was scribed in my presence. The recorded information has been reviewed and is accurate.    Rolland Porter, MD 04/27/14 2155

## 2014-04-27 NOTE — ED Notes (Signed)
C/o posterior neck pain, upper back and chest pain-started yesterday am

## 2014-04-27 NOTE — Discharge Instructions (Signed)

## 2014-09-08 ENCOUNTER — Emergency Department (HOSPITAL_COMMUNITY): Payer: Self-pay

## 2014-09-08 ENCOUNTER — Emergency Department (HOSPITAL_COMMUNITY)
Admission: EM | Admit: 2014-09-08 | Discharge: 2014-09-08 | Disposition: A | Discharge status: Home / Self Care | Payer: Self-pay | Attending: Emergency Medicine | Admitting: Emergency Medicine

## 2014-09-08 ENCOUNTER — Encounter (HOSPITAL_COMMUNITY): Payer: Self-pay | Admitting: Emergency Medicine

## 2014-09-08 DIAGNOSIS — R002 Palpitations: Secondary | ICD-10-CM

## 2014-09-08 DIAGNOSIS — R079 Chest pain, unspecified: Secondary | ICD-10-CM

## 2014-09-08 DIAGNOSIS — Z79899 Other long term (current) drug therapy: Secondary | ICD-10-CM | POA: Insufficient documentation

## 2014-09-08 DIAGNOSIS — Z791 Long term (current) use of non-steroidal anti-inflammatories (NSAID): Secondary | ICD-10-CM | POA: Insufficient documentation

## 2014-09-08 DIAGNOSIS — R0789 Other chest pain: Secondary | ICD-10-CM | POA: Insufficient documentation

## 2014-09-08 DIAGNOSIS — Z720 Tobacco use: Secondary | ICD-10-CM | POA: Insufficient documentation

## 2014-09-08 LAB — CBC WITH DIFFERENTIAL/PLATELET
Basophils Absolute: 0 10*3/uL (ref 0.0–0.1)
Basophils Relative: 0 % (ref 0–1)
Eosinophils Absolute: 0.2 10*3/uL (ref 0.0–0.7)
Eosinophils Relative: 3 % (ref 0–5)
HCT: 40 % (ref 39.0–52.0)
Hemoglobin: 13.4 g/dL (ref 13.0–17.0)
Lymphocytes Relative: 30 % (ref 12–46)
Lymphs Abs: 2.1 10*3/uL (ref 0.7–4.0)
MCH: 27.9 pg (ref 26.0–34.0)
MCHC: 33.5 g/dL (ref 30.0–36.0)
MCV: 83.3 fL (ref 78.0–100.0)
Monocytes Absolute: 0.7 10*3/uL (ref 0.1–1.0)
Monocytes Relative: 10 % (ref 3–12)
Neutro Abs: 4 10*3/uL (ref 1.7–7.7)
Neutrophils Relative %: 57 % (ref 43–77)
Platelets: 211 10*3/uL (ref 150–400)
RBC: 4.8 MIL/uL (ref 4.22–5.81)
RDW: 14.6 % (ref 11.5–15.5)
WBC: 7 10*3/uL (ref 4.0–10.5)

## 2014-09-08 LAB — BASIC METABOLIC PANEL
Anion gap: 7 (ref 5–15)
BUN: 16 mg/dL (ref 6–20)
CO2: 24 mmol/L (ref 22–32)
Calcium: 9.4 mg/dL (ref 8.9–10.3)
Chloride: 108 mmol/L (ref 101–111)
Creatinine, Ser: 1.22 mg/dL (ref 0.61–1.24)
GFR calc Af Amer: >60 mL/min (ref >60)
GFR calc non Af Amer: >60 mL/min (ref >60)
Glucose, Bld: 106 mg/dL — ABNORMAL HIGH (ref 65–99)
Potassium: 4.2 mmol/L (ref 3.5–5.1)
Sodium: 139 mmol/L (ref 135–145)

## 2014-09-08 LAB — TROPONIN I: Troponin I: <0.03 ng/mL (ref <0.031)

## 2014-09-08 NOTE — ED Notes (Signed)
Awake. Verbally responsive. A/O x4. Resp even and unlabored. No audible adventitious breath sounds noted. ABC's intact.  

## 2014-09-08 NOTE — ED Notes (Signed)
Awake. Verbally responsive. A/O x4. Resp even and unlabored. No audible adventitious breath sounds noted. ABC's intact. SB on monitor.

## 2014-09-08 NOTE — ED Notes (Signed)
Dr. Kohut at bedside 

## 2014-09-08 NOTE — ED Provider Notes (Signed)
CSN: [id number]     Arrival date & time 09/08/14  0731 History   First MD Initiated Contact with Patient 09/08/14 [phone]     Chief Complaint  Patient presents with  . Chest Pain     (Consider location/radiation/quality/duration/timing/severity/associated sxs/prior Treatment) HPI   23yM with palpitations. Onset this morning. Heart skipping beats. No appreciable exacerbating or relieving factors. Nothing sustained. Also sharp pain in upper chest neck. Often worse with extreme flexion of neck. No back pain. Feels like may have slept akwardly. No fever or chills. No n/v. No medications. No significant caffeine intake. No drugs. No unusual leg pain or swelling.   History reviewed. No pertinent past medical history. Past Surgical History  Procedure Laterality Date  . Toe surgery     Family History  Problem Relation Age of Onset  . Heart failure Mother   . Hypertension Father   . Hypertension Brother    History  Substance Use Topics  . Smoking status: Current Every Day Smoker -- 0.50 packs/day  . Smokeless tobacco: Not on file  . Alcohol Use: No    Review of Systems  All systems reviewed and negative, other than as noted in HPI.   Allergies  Review of patient's allergies indicates no known allergies.  Home Medications   Prior to Admission medications   Medication Sig Start Date End Date Taking? Authorizing Provider  methocarbamol (ROBAXIN) 500 MG tablet Take 1 tablet (500 mg total) by mouth 2 (two) times daily. Patient not taking: Reported on 09/08/2014 04/27/14   Rolland Porter, MD  naproxen (NAPROSYN) 500 MG tablet Take 1 tablet (500 mg total) by mouth 2 (two) times daily. Patient not taking: Reported on 09/08/2014 04/27/14   Rolland Porter, MD  traMADol (ULTRAM) 50 MG tablet Take 1 tablet (50 mg total) by mouth every 6 (six) hours as needed. Patient not taking: Reported on 09/08/2014 04/27/14   Rolland Porter, MD   BP 131/75 mmHg  Pulse 58  Temp(Src) 98.1 F (36.7 C) (Oral)  Resp 17  Ht   (1.803 m)  Wt 202 lb (91.627 kg)  BMI 28.19 kg/m2  SpO2 100% Physical Exam  Constitutional: He appears well-developed and well-nourished. No distress.  HENT:  Head: Normocephalic and atraumatic.  Eyes: Conjunctivae are normal. Right eye exhibits no discharge. Left eye exhibits no discharge.  Neck: Neck supple.  Cardiovascular: Normal rate, regular rhythm and normal heart sounds.  Exam reveals no gallop and no friction rub.   No murmur heard. Pulmonary/Chest: Effort normal and breath sounds normal. No respiratory distress.  Abdominal: Soft. He exhibits no distension. There is no tenderness.  Musculoskeletal: He exhibits no edema or tenderness.  Lower extremities symmetric as compared to each other. No calf tenderness. Negative Homan's. No palpable cords.   Neurological: He is alert.  Skin: Skin is warm and dry.  Psychiatric: He has a normal mood and affect. His behavior is normal. Thought content normal.  Nursing note and vitals reviewed.   ED Course  Procedures (including critical care time) Labs Review Labs Reviewed  BASIC METABOLIC PANEL - Abnormal; Notable for the following:    Glucose, Bld 106 (*)    All other components within normal limits  CBC WITH DIFFERENTIAL/PLATELET  TROPONIN I    Imaging Review Dg Chest 2 View  09/08/2014   CLINICAL DATA:  Left chest pain intermittently for past 2 years, getting worse yesterday. Extending into the neck.  EXAM: CHEST  2 VIEW  COMPARISON:  None.  FINDINGS: The  heart size and mediastinal contours are within normal limits. Both lungs are clear. The visualized skeletal structures are unremarkable.  IMPRESSION: No active cardiopulmonary disease.   Electronically Signed   By: Charlett NoseKevin  Dover M.D.   On: 09/08/2014 08:12     EKG Interpretation   Date/Time:  Wednesday September 08 2014 07:36:14 EDT Ventricular Rate:  55 PR Interval:  132 QRS Duration: 92 QT Interval:  399 QTC Calculation: 382 R Axis:   85 Text Interpretation:  Sinus  rhythm No significant change since last  tracing Confirmed by Drako Maese  MD, Albion Weatherholtz ([id number]) on 09/08/2014 7:47:25 AM      MDM   Final diagnoses:  Chest pain  Heart palpitations   23yM with CP. Very atypical for ACS. Doubt PE, infectious, dissection or other emergent process. Suspect palpitations are PVCs. None seen on EKG or monitor but no symptoms while in ED. I get this sense from his description though. W/u unremarkable. It has been determined that no acute conditions requiring further emergency intervention are present at this time. The patient has been advised of the diagnosis and plan. I reviewed any labs and imaging including any potential incidental findings. We have discussed signs and symptoms that warrant return to the ED and they are listed in the discharge instructions.      Raeford RazorStephen Daenerys Buttram, MD 09/15/14 [phone]

## 2014-09-08 NOTE — Discharge Instructions (Signed)
Palpitations °A palpitation is the feeling that your heartbeat is irregular or is faster than normal. It may feel like your heart is fluttering or skipping a beat. Palpitations are usually not a serious problem. However, in some cases, you may need further medical evaluation. °CAUSES  °Palpitations can be caused by: °· Smoking. °· Caffeine or other stimulants, such as diet pills or energy drinks. °· Alcohol. °· Stress and anxiety. °· Strenuous physical activity. °· Fatigue. °· Certain medicines. °· Heart disease, especially if you have a history of irregular heart rhythms (arrhythmias), such as atrial fibrillation, atrial flutter, or supraventricular tachycardia. °· An improperly working pacemaker or defibrillator. °DIAGNOSIS  °To find the cause of your palpitations, your health care provider will take your medical history and perform a physical exam. Your health care provider may also have you take a test called an ambulatory electrocardiogram (ECG). An ECG records your heartbeat patterns over a 24-hour period. You may also have other tests, such as: °· Transthoracic echocardiogram (TTE). During echocardiography, sound waves are used to evaluate how blood flows through your heart. °· Transesophageal echocardiogram (TEE). °· Cardiac monitoring. This allows your health care provider to monitor your heart rate and rhythm in real time. °· Holter monitor. This is a portable device that records your heartbeat and can help diagnose heart arrhythmias. It allows your health care provider to track your heart activity for several days, if needed. °· Stress tests by exercise or by giving medicine that makes the heart beat faster. °TREATMENT  °Treatment of palpitations depends on the cause of your symptoms and can vary greatly. Most cases of palpitations do not require any treatment other than time, relaxation, and monitoring your symptoms. Other causes, such as atrial fibrillation, atrial flutter, or supraventricular  tachycardia, usually require further treatment. °HOME CARE INSTRUCTIONS  °· Avoid: °· Caffeinated coffee, tea, soft drinks, diet pills, and energy drinks. °· Chocolate. °· Alcohol. °· Stop smoking if you smoke. °· Reduce your stress and anxiety. Things that can help you relax include: °· A method of controlling things in your body, such as your heartbeats, with your mind (biofeedback). °· Yoga. °· Meditation. °· Physical activity such as swimming, jogging, or walking. °· Get plenty of rest and sleep. °SEEK MEDICAL CARE IF:  °· You continue to have a fast or irregular heartbeat beyond 24 hours. °· Your palpitations occur more often. °SEEK IMMEDIATE MEDICAL CARE IF: °· You have chest pain or shortness of breath. °· You have a severe headache. °· You feel dizzy or you faint. °MAKE SURE YOU: °· Understand these instructions. °· Will watch your condition. °· Will get help right away if you are not doing well or get worse. °Document Released: 02/17/2000 Document Revised: 02/24/2013 Document Reviewed: 04/20/2011 °ExitCare® Patient Information ©2015 [employer]. This information is not intended to replace advice given to you by your health care provider. Make sure you discuss any questions you have with your health care provider. ° °Chest Pain (Nonspecific) °It is often hard to give a specific diagnosis for the cause of chest pain. There is always a chance that your pain could be related to something serious, such as a heart attack or a blood clot in the lungs. You need to follow up with your health care provider for further evaluation. °CAUSES  °· Heartburn. °· Pneumonia or bronchitis. °· Anxiety or stress. °· Inflammation around your heart (pericarditis) or lung (pleuritis or pleurisy). °· A blood clot in the lung. °· A collapsed lung (pneumothorax). It   can develop suddenly on its own (spontaneous pneumothorax) or from trauma to the chest. °· Shingles infection (herpes zoster virus). °The chest wall is composed of  bones, muscles, and cartilage. Any of these can be the source of the pain. °· The bones can be bruised by injury. °· The muscles or cartilage can be strained by coughing or overwork. °· The cartilage can be affected by inflammation and become sore (costochondritis). °DIAGNOSIS  °Lab tests or other studies may be needed to find the cause of your pain. Your health care provider may have you take a test called an ambulatory electrocardiogram (ECG). An ECG records your heartbeat patterns over a 24-hour period. You may also have other tests, such as: °· Transthoracic echocardiogram (TTE). During echocardiography, sound waves are used to evaluate how blood flows through your heart. °· Transesophageal echocardiogram (TEE). °· Cardiac monitoring. This allows your health care provider to monitor your heart rate and rhythm in real time. °· Holter monitor. This is a portable device that records your heartbeat and can help diagnose heart arrhythmias. It allows your health care provider to track your heart activity for several days, if needed. °· Stress tests by exercise or by giving medicine that makes the heart beat faster. °TREATMENT  °· Treatment depends on what may be causing your chest pain. Treatment may include: °¨ Acid blockers for heartburn. °¨ Anti-inflammatory medicine. °¨ Pain medicine for inflammatory conditions. °¨ Antibiotics if an infection is present. °· You may be advised to change lifestyle habits. This includes stopping smoking and avoiding alcohol, caffeine, and chocolate. °· You may be advised to keep your head raised (elevated) when sleeping. This reduces the chance of acid going backward from your stomach into your esophagus. °Most of the time, nonspecific chest pain will improve within 2-3 days with rest and mild pain medicine.  °HOME CARE INSTRUCTIONS  °· If antibiotics were prescribed, take them as directed. Finish them even if you start to feel better. °· For the next few days, avoid physical  activities that bring on chest pain. Continue physical activities as directed. °· Do not use any tobacco products, including cigarettes, chewing tobacco, or electronic cigarettes. °· Avoid drinking alcohol. °· Only take medicine as directed by your health care provider. °· Follow your health care provider's suggestions for further testing if your chest pain does not go away. °· Keep any follow-up appointments you made. If you do not go to an appointment, you could develop lasting (chronic) problems with pain. If there is any problem keeping an appointment, call to reschedule. °SEEK MEDICAL CARE IF:  °· Your chest pain does not go away, even after treatment. °· You have a rash with blisters on your chest. °· You have a fever. °SEEK IMMEDIATE MEDICAL CARE IF:  °· You have increased chest pain or pain that spreads to your arm, neck, jaw, back, or abdomen. °· You have shortness of breath. °· You have an increasing cough, or you cough up blood. °· You have severe back or abdominal pain. °· You feel nauseous or vomit. °· You have severe weakness. °· You faint. °· You have chills. °This is an emergency. Do not wait to see if the pain will go away. Get medical help at once. Call your local emergency services (911 in [place] Do not drive yourself to the hospital. °MAKE SURE YOU:  °· Understand these instructions. °· Will watch your condition. °· Will get help right away if you are not doing well or get worse. °Document   Released: 11/29/2004 Document Revised: 02/24/2013 Document Reviewed: 09/25/2007 °ExitCare® Patient Information ©2015 Exit[employer], [employer]. This information is not intended to replace advice given to you by your health care provider. Make sure you discuss any questions you have with your health care provider. ° ° °Emergency Department Resource Guide °1) Find a Doctor and Pay Out of Pocket °Although you won't have to find out who is covered by your insurance plan, it is a good idea to ask around and get recommendations.  You will then need to call the office and see if the doctor you have chosen will accept you as a new patient and what types of options they offer for patients who are self-pay. Some doctors offer discounts or will set up payment plans for their patients who do not have insurance, but you will need to ask so you aren't surprised when you get to your appointment. ° °2) Contact Your Local Health Department °Not all health departments have doctors that can see patients for sick visits, but many do, so it is worth a call to see if yours does. If you don't know where your local health department is, you can check in your phone book. The CDC also has a tool to help you locate your state's health department, and many state websites also have listings of all of their local health departments. ° °3) Find a Walk-in Clinic °If your illness is not likely to be very severe or complicated, you may want to try a walk in clinic. These are popping up all over the country in pharmacies, drugstores, and shopping centers. They're usually staffed by nurse practitioners or physician assistants that have been trained to treat common illnesses and complaints. They're usually fairly quick and inexpensive. However, if you have serious medical issues or chronic medical problems, these are probably not your best option. ° °No Primary Care Doctor: °- Call [facility] at  832-8000 - they can help you locate a primary care doctor that  accepts your insurance, provides certain services, etc. °- Physician Referral Service- 1-800-533-3463 ° °Chronic Pain Problems: °Organization         Address  Phone   Notes  °[facility]  (336) 297-2271 Patients need to be referred by their primary care doctor.  ° °Medication Assistance: °Organization         Address  Phone   Notes  °[place] [facility] [address] °[place], [place] [address] (336) 641-8030 --Must be a resident of [place] °--  Must have NO insurance coverage whatsoever (no Medicaid/ Medicare, etc.) °-- The pt. MUST have a primary care doctor that directs their care regularly and follows them in the community °  °[facility]  (866) 331-1348   °[employer]  (888) 892-1162   ° °Agencies that provide inexpensive medical care: °Organization         Address  Phone   Notes  °[facility]  (336) 832-8035   °[facility]    (336) 832-7272   °[facility] [address] °[place], [place] [address] (336) 832-4777   °[facility] [address], °[place] (336) 271-4999   °[facility]    (336) 373-0678   °[facility]    (336) 272-1050   °[facility] ° [address], [place] Phone:  (336) 832-4444, Fax:  (336) 832-4440 Hours of Operation:  9 am - 6 pm, M-F.    Also accepts Medicaid/Medicare and self-pay.  °[facility] ° [address], [place] Phone: (336) [phone], Fax: (336) [phone]. Hours of Operation:  8:30 am - 5:30 pm, M-F.  Also accepts Medicaid and self-pay.  °[facility] [place] [address], [place] Phone: (336) 878-6027   °[facility] [address], [place], [place] (336)723-1848, Ext. 123 Mondays & Thursdays: 7-9 AM.  First 15 patients are seen on a first come, first serve basis. °  ° °Medicaid-accepting [place] Providers: ° °Organization         Address  Phone   Notes  °[facility] [address], [place] (336) 641-2100 Also accepts self-pay patients.  °[facility] [address], [place] ° (336) 856-9996   °[facility] [address], [place] (336) 288-8857   °[facility] [address] [place] [address], [place] (336) 299-7000   °Veita Bland [address], [place]  ° (336) [phone] Only accepts [place] Access Medicaid patients  after they have their name applied to their card.  ° °Self-Pay (no insurance) in [place]: ° °Organization         Address  Phone   Notes  °Sickle Cell Patients, [facility] [address], [place] (336) 832-1970   °[facility] [address], [place] (336) 832-4400   °[facility] ° [address], [place] (336) 992-4800   °[facility]/Dr. Osei-Bonsu ° [address] [place] [address], [place] or [address], [place] (336) [phone] Phone number for both [place] and  [place] locations is the same.  °[facility] [address], [place] (336) 299-0000   °[facility] [address] [place] [address], [place] or [address] (336) 852-7530 °(336) 878-2260   °[facility] [address], [place] (336) 350-1642, phone; (336) 294-5005, fax Sees patients 1st and 3rd Saturday of every month.  Must not qualify for public or private insurance (i.e. Medicaid, Medicare, [place] Health Choice, Veterans' Benefits) • Household income should be no more than 200% of the poverty level •The clinic cannot treat you if you are pregnant or think you are pregnant • Sexually transmitted diseases are not treated at the clinic.  ° ° °Dental Care: °Organization         Address  Phone  Notes  °[facility] [address], [place] (336) 641-6152 Accepts children up to age 21 who are enrolled in Medicaid or [place] Health Choice; pregnant women with a Medicaid card; and children who have applied for Medicaid or [place] Health Choice, but were declined, whose parents can pay a reduced fee at time of service.  °[facility] [place]  [address], [place] (336) 641-7733 Accepts children up to age 21 who are enrolled in Medicaid or [place] Health Choice; pregnant women with a Medicaid card; and children  who have applied for Medicaid or  Health Choice, but were declined, whose parents can pay a reduced fee at time of service.  °[facility] ° [address],  (336) 641-4533 Patients are seen by appointment only. Walk-ins are not accepted. [facility] will see patients 18 years of age and older. °Monday - Tuesday (  8am-5pm) °Most Wednesdays (8:30-5pm) °$30 per visit, cash only  °[facility] ° [address], [place] (336) [phone] Patients are seen by appointment only. Walk-ins are not accepted. [facility] will see patients 18 years of age and older. °One Wednesday Evening (Monthly: Volunteer Based).  $30 per visit, cash only  °[facility]  (919) [phone] for adults; Children under age 4, call Graduate Pediatric Dentistry at ([phone]) [phone]. Children aged 4-14, please call ([phone]) [phone] to request a pediatric application. ° Dental services are provided in all areas of dental care including fillings, crowns and bridges, complete and partial dentures, implants, gum treatment, root canals, and extractions. Preventive care is also provided. Treatment is provided to both adults and children. °Patients are selected via a lottery and there is often a waiting list. °  °[facility] [address], °[place] ° (336) 763-8833 [email or web address] °  °[facility] [address], [place], [place] (336)723-1848, Ext. 123 Second and Fourth Thursday of each month, opens at 6:30 AM; Clinic ends at 9 AM.  Patients are seen on a first-come first-served basis, and a limited number are seen during each clinic.  ° °[facility] ° [address], [place], [place] (336) [phone]   Eligibility Requirements °You must have lived in [place], [place], or [place] counties for at least the last three months. °  You cannot be eligible for state or federal sponsored healthcare insurance, including Veterans  Administration, Medicaid, or Medicare. °  You generally cannot be eligible for healthcare insurance through your employer.  °  How to apply: °Eligibility screenings are held every Tuesday and Wednesday afternoon from 1:00 pm until 4:00 pm. You do not need an appointment for the interview!  °[facility] [address], [place], [place] 336-631-2330   °[facility]  336-342-8273   °[facility]  336-703-3100   °[facility]  336-570-6415   ° °Behavioral Health Resources in the Community: °Intensive Outpatient Programs °Organization         Address  Phone  Notes  °[facility] [address], [place], [place] 336-878-6098   °[facility] [address], [place], [place] 336-832-9800   °[facility] [address], [place], [place] ° 336-882-2125   °[facility] [address],  °[place], [place] 1-800-853-5163 or 336-641-4981   °Substance Abuse Resources °Organization         Address  Phone  Notes  °[facility]  336-882-2125   °[facility]  336-784-9470   °[facility]  336-285-9073   °[facility]  336-845-3988   °Residential & Outpatient Substance Abuse Program  1-800-659-3381   °Psychological Services °Organization         Address  Phone  Notes  °[facility]  336- 832-9600   °[facility]  336- 378-7881   °[facility] [address], [place] 1-800-853-5163 or 336-641-4981   ° °Mobile Crisis Teams °Organization         Address  Phone  Notes  °[facility], Mobile Crisis Care Unit  1-877-626-1772   °Assertive °[facility] ° [address] [place], [place] 336-834-9664   °Sharon DeEsch [address] °[place] 336-554-5454   ° °Self-Help/Support Groups °Organization         Address  Phone               Notes  °[facility] -  variety of support groups  336- 373-1402 Call for more information  °Narcotics Anonymous (NA), [facility] [address], °[place]  2 meetings at this location  ° °Residential Treatment Programs °Organization         Address  Phone  Notes  °[facility] [address],    °[place]  1-866-801-8205   °[facility] ° [address], [place], [place] 704-293-8524   °[facility] [address], [place] 336-845-3988 Admissions: 8am-3pm M-F  °[facility] [address]    °[place], [place] 336-841-1104   °[facility] [address], [place], [place] 336-379-7146   °[facility] [address]  °[place], [place] 336-285-9073   °[facility] - Intensive Outpatient [address], , [place] 336-852-3033   °[facility] [address]  °[place], [place] 1-877-615-2722 or 336-784-9470   °[facility] [address] [place], [place] 336-227-7417 Accepts Medicaid  °[facility] [address]  ° [place] 1-800-659-3381 Substance Abuse/Addiction Treatment  ° °[place] Behavioral Health Resources °Organization         Address  Phone  Notes  °[facility]  (888) 581-9988   °Julie Brannon, PhD [address] [place], [place]   (336) 349-5553 or (336) 951-0000   °[facility]   [address] °[place], [place] (336) 349-4454   °[facility] [address], [place], [place] (336) 342-8316 Insurance/Medicaid/sponsorship through [facility]  °[facility] [address]                                    [place], [place] (336) 342-8316 Therapy/tele-psych/case  °[facility] [address]  ° [place], [place] (336) 349-2233    °Dr. Arfeen  (336) 349-4544   °[facility] [place]  [employer] [place] [facility] 1) [address], [place] °2) [address], [place] °3)  [address], [place] (336) 349-3220 °(336) 342-7768 ° °(336) 342-8140   °[place] [facility] (336) 342-1394 or (336) 342-3537 (After Hours)    ° ° ° °

## 2014-09-08 NOTE — ED Notes (Addendum)
Pt reported central chest pain from neck to epigastic area with diaphoresis. Pt denies SHOB, nausea, and radiation. Pt repoted has the sharp pain when he looks down. Denies coughing or resp problems. Pt reported posterior neck pain. "I think I slept on it wrong".

## 2014-09-08 NOTE — ED Notes (Signed)
Patient transported to X-ray 

## 2015-01-28 ENCOUNTER — Emergency Department (HOSPITAL_COMMUNITY)
Admission: EM | Admit: 2015-01-28 | Discharge: 2015-01-28 | Disposition: A | Discharge status: Home / Self Care | Payer: Self-pay | Attending: Emergency Medicine | Admitting: Emergency Medicine

## 2015-01-28 ENCOUNTER — Emergency Department (HOSPITAL_COMMUNITY): Payer: Self-pay

## 2015-01-28 ENCOUNTER — Encounter (HOSPITAL_COMMUNITY): Payer: Self-pay | Admitting: Emergency Medicine

## 2015-01-28 DIAGNOSIS — X58XXXA Exposure to other specified factors, initial encounter: Secondary | ICD-10-CM | POA: Insufficient documentation

## 2015-01-28 DIAGNOSIS — S20211A Contusion of right front wall of thorax, initial encounter: Secondary | ICD-10-CM | POA: Insufficient documentation

## 2015-01-28 DIAGNOSIS — Y998 Other external cause status: Secondary | ICD-10-CM | POA: Insufficient documentation

## 2015-01-28 DIAGNOSIS — Y9372 Activity, wrestling: Secondary | ICD-10-CM | POA: Insufficient documentation

## 2015-01-28 DIAGNOSIS — Y92009 Unspecified place in unspecified non-institutional (private) residence as the place of occurrence of the external cause: Secondary | ICD-10-CM | POA: Insufficient documentation

## 2015-01-28 DIAGNOSIS — F17210 Nicotine dependence, cigarettes, uncomplicated: Secondary | ICD-10-CM | POA: Insufficient documentation

## 2015-01-28 MED ORDER — TRAMADOL HCL 50 MG PO TABS: 50.0000 mg | ORAL_TABLET | Freq: Four times a day (QID) | ORAL | Status: AC | PRN

## 2015-01-28 MED ORDER — IBUPROFEN 600 MG PO TABS: 600.0000 mg | ORAL_TABLET | Freq: Four times a day (QID) | ORAL | Status: AC | PRN

## 2015-01-28 MED ORDER — IBUPROFEN 800 MG PO TABS
800.0000 mg | ORAL_TABLET | Freq: Once | ORAL | Status: AC
  Administered 2015-01-28: 800 mg via ORAL
  Filled 2015-01-28: qty 1

## 2015-01-28 NOTE — ED Notes (Signed)
Patient reports "horseplaying" last night and now has right rib pain. Patient states he has trouble taking a deep breath, pain with laughing and coughing. Patient has not taken any medication at home for pain.

## 2015-01-28 NOTE — Discharge Instructions (Signed)
Use incentive spirometer machine every few hours. Ibuprofen for pain. Tramadol for severe pain. Follow-up with primary care doctor as needed. Return if fever, increased pain, blood and urine, vomiting, new concerning symptom.    Chest Contusion A chest contusion is a deep bruise on your chest area. Contusions are the result of an injury that caused bleeding under the skin. A chest contusion may involve bruising of the skin, muscles, or ribs. The contusion may turn blue, purple, or yellow. Minor injuries will give you a painless contusion, but more severe contusions may stay painful and swollen for a few weeks. CAUSES  A contusion is usually caused by a blow, trauma, or direct force to an area of the body. SYMPTOMS   Swelling and redness of the injured area.  Discoloration of the injured area.  Tenderness and soreness of the injured area.  Pain. DIAGNOSIS  The diagnosis can be made by taking a history and performing a physical exam. An X-ray, CT scan, or MRI may be needed to determine if there were any associated injuries, such as broken bones (fractures) or internal injuries. TREATMENT  Often, the best treatment for a chest contusion is resting, icing, and applying cold compresses to the injured area. Deep breathing exercises may be recommended to reduce the risk of pneumonia. Over-the-counter medicines may also be recommended for pain control. HOME CARE INSTRUCTIONS   Put ice on the injured area.  Put ice in a plastic bag.  Place a towel between your skin and the bag.  Leave the ice on for 15-20 minutes, 03-04 times a day.  Only take over-the-counter or prescription medicines as directed by your caregiver. Your caregiver may recommend avoiding anti-inflammatory medicines (aspirin, ibuprofen, and naproxen) for 48 hours because these medicines may increase bruising.  Rest the injured area.  Perform deep-breathing exercises as directed by your caregiver.  Stop smoking if you  smoke.  Do not lift objects over 5 pounds (2.3 kg) for 3 days or longer if recommended by your caregiver. SEEK IMMEDIATE MEDICAL CARE IF:   You have increased bruising or swelling.  You have pain that is getting worse.  You have difficulty breathing.  You have dizziness, weakness, or fainting.  You have blood in your urine or stool.  You cough up or vomit blood.  Your swelling or pain is not relieved with medicines. MAKE SURE YOU:   Understand these instructions.  Will watch your condition.  Will get help right away if you are not doing well or get worse.   This information is not intended to replace advice given to you by your health care provider. Make sure you discuss any questions you have with your health care provider.   Document Released: 11/14/2000 Document Revised: 11/14/2011 Document Reviewed: 08/13/2011 Elsevier Interactive Patient Education [employer]2016 [employer]

## 2015-01-28 NOTE — ED Provider Notes (Signed)
CSN: [id number]     Arrival date & time 01/28/15  4098 History   First MD Initiated Contact with Patient 01/28/15 [phone]     Chief Complaint  Patient presents with  . right rib pain      (Consider location/radiation/quality/duration/timing/severity/associated sxs/prior Treatment) HPI Stephen Salinas is a 24 y.o. male presents to emergency department complaining of right rib pain. Patient states he was wrestling around with his family member when he got takes in the right ribs. Patient reports pain, that is worse with movement and taking a deep breath. He did not take anything for pain prior to coming in. He denies any abdominal pain. No vomiting. Denies any blood in his urine. No history of problems of broken ribs in the past.  History reviewed. No pertinent past medical history. Past Surgical History  Procedure Laterality Date  . Toe surgery     Family History  Problem Relation Age of Onset  . Heart failure Mother   . Hypertension Father   . Hypertension Brother    Social History  Substance Use Topics  . Smoking status: Current Every Day Smoker -- 0.50 packs/day    Types: Cigars  . Smokeless tobacco: None  . Alcohol Use: No     Comment: occ    Review of Systems  Constitutional: Negative for fever and chills.  Respiratory: Negative for cough, chest tightness and shortness of breath.   Cardiovascular: Positive for chest pain. Negative for palpitations and leg swelling.  Gastrointestinal: Negative for nausea, vomiting, abdominal pain, diarrhea and abdominal distention.  Genitourinary: Negative for dysuria, urgency, frequency and hematuria.  Skin: Negative for rash.  Allergic/Immunologic: Negative for immunocompromised state.  Neurological: Negative for dizziness, weakness, light-headedness, numbness and headaches.  All other systems reviewed and are negative.     Allergies  Review of patient's allergies indicates no known allergies.  Home Medications   Prior to  Admission medications   Medication Sig Start Date End Date Taking? Authorizing Provider  methocarbamol (ROBAXIN) 500 MG tablet Take 1 tablet (500 mg total) by mouth 2 (two) times daily. Patient not taking: Reported on 09/08/2014 04/27/14   Rolland Porter, MD  naproxen (NAPROSYN) 500 MG tablet Take 1 tablet (500 mg total) by mouth 2 (two) times daily. Patient not taking: Reported on 09/08/2014 04/27/14   Rolland Porter, MD  traMADol (ULTRAM) 50 MG tablet Take 1 tablet (50 mg total) by mouth every 6 (six) hours as needed. Patient not taking: Reported on 09/08/2014 04/27/14   Rolland Porter, MD   BP 144/94 mmHg  Pulse 93  Temp(Src) 98.6 F (37 C) (Oral)  Resp 16  Ht  (1.803 m)  Wt 88.451 kg  BMI 27.21 kg/m2  SpO2 100% Physical Exam  Constitutional: He appears well-developed and well-nourished. No distress.  HENT:  Head: Normocephalic and atraumatic.  Eyes: Conjunctivae are normal.  Neck: Neck supple.  Cardiovascular: Normal rate, regular rhythm and normal heart sounds.   Pulmonary/Chest: Effort normal. No respiratory distress. He has no wheezes. He has no rales. He exhibits tenderness.    Tenderness along the right lower ribs mainly in the anterior chest radiating to the mid axillary line. No bruising or swelling. No crepitus. No deformity.  Abdominal: Soft. Bowel sounds are normal. He exhibits no distension. There is no tenderness. There is no rebound.  No TTP. No CVA tenderness  Musculoskeletal: He exhibits no edema.  Neurological: He is alert.  Skin: Skin is warm and dry.  Nursing note and vitals reviewed.  ED Course  Procedures (including critical care time) Labs Review Labs Reviewed - No data to display  Imaging Review Dg Ribs Unilateral W/chest Right  01/28/2015  CLINICAL DATA:  Roughhousing at home last night. Now with right-sided rib pain. Difficulty taking a deep breath. Pain with laughing or coughing. EXAM: RIGHT RIBS AND CHEST - 3+ VIEW COMPARISON:  Two-view chest x-ray  09/08/2014. FINDINGS: The heart size is normal. Ill-defined right lower lobe airspace disease is present. There is no pneumothorax. No acute or healing rib fracture is present. IMPRESSION: 1. No acute or healing rib fracture. 2. Ill-defined right lower lobe airspace disease is most concerning for pneumonia. Focal contusion or aspiration is considered less likely. Electronically Signed   By: Marin Robertshristopher  Mattern M.D.   On: 01/28/2015 10:23   I have personally reviewed and evaluated these images and lab results as part of my medical decision-making.   EKG Interpretation None      MDM   Final diagnoses:  Rib contusion, right, initial encounter    Patient with a right lower rib pain after wrestling around yesterday. He does not have any abdominal tenderness, doubt liver or any other intra-abdominal injuries. Chest x-ray shows no fractured ribs, however mentions that there is ill-defined right lower lobe airspace disease, which 2 that was concerning for pneumonia. Patient has no cough, no congestion, no fever, no other chest problems prior to getting hit. I do not think he has pneumonia. Most likely contusion versus atelectasis. We'll place him on incentive spirometer. Home with ibuprofen and tramadol. Follow up with primary care doctor. Discussed with Dr. Jeraldine LootsLockwood who agrees.  BP 144/94 mmHg  Pulse 93  Temp(Src) 98.6 F (37 C) (Oral)  Resp 16  Ht 5\' 11"  (1.803 m)  Wt 88.451 kg  BMI 27.21 kg/m2  SpO2 100%    Jaynie Crumbleatyana Taneia Mealor, PA-C 01/28/15 1732  Gerhard Munchobert Lockwood, MD 01/29/15 1209

## 2016-09-04 IMAGING — CR DG RIBS W/ CHEST 3+V*R*
5 series · 5 of 5 positions shown · non-contrast
Comparison: Two-view chest x-ray 09/08/2014.

CLINICAL DATA: Roughhousing at home last night. Now with
right-sided rib pain. Difficulty taking a deep breath. Pain with
laughing or coughing.

EXAM:
RIGHT RIBS AND CHEST - 3+ VIEW

[w chest pa]
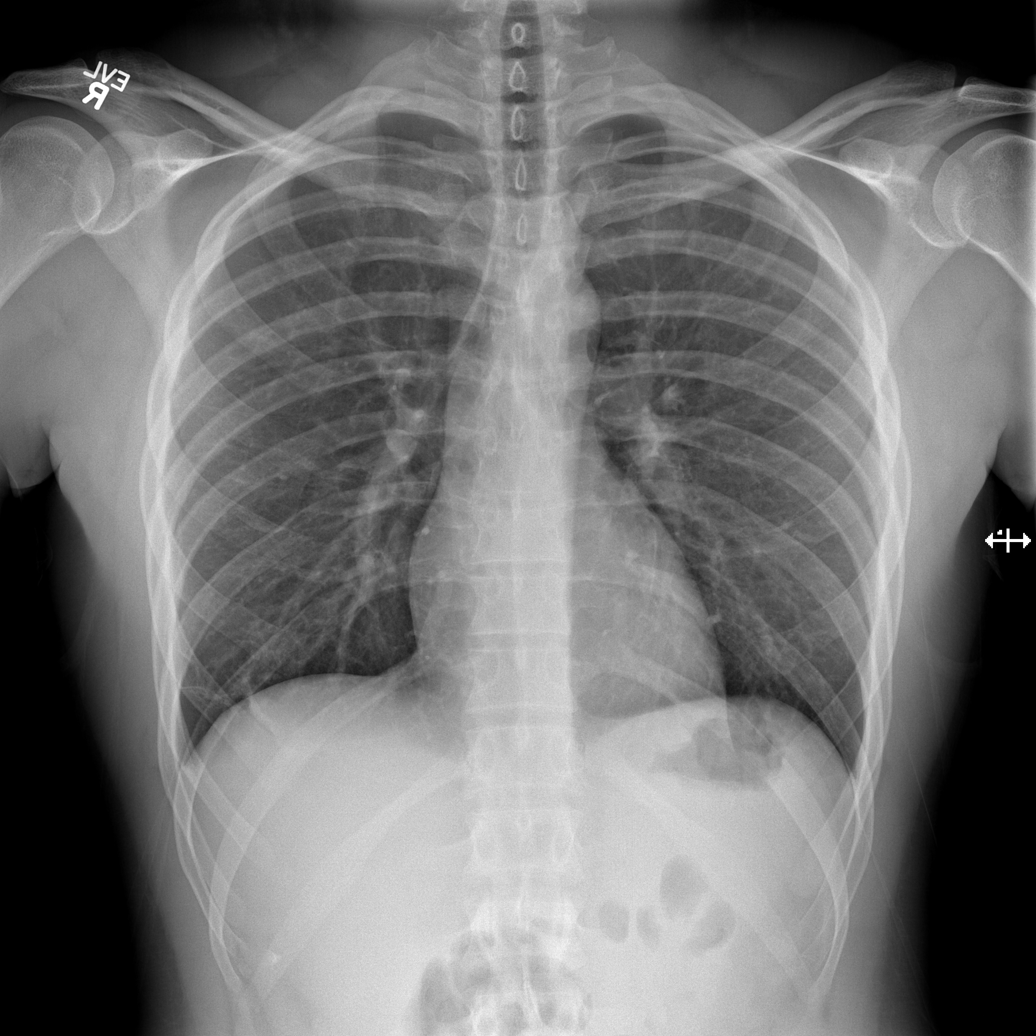

[w ribs ap upper right]
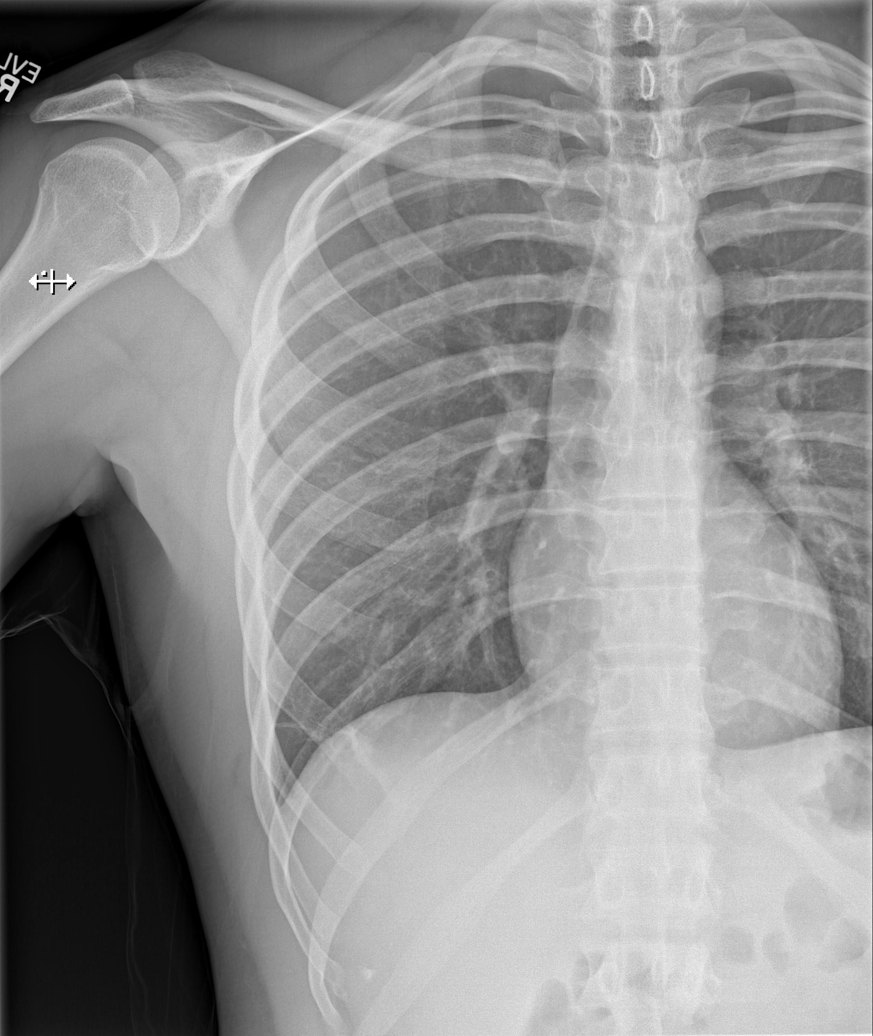

[w ribs ap lower right]
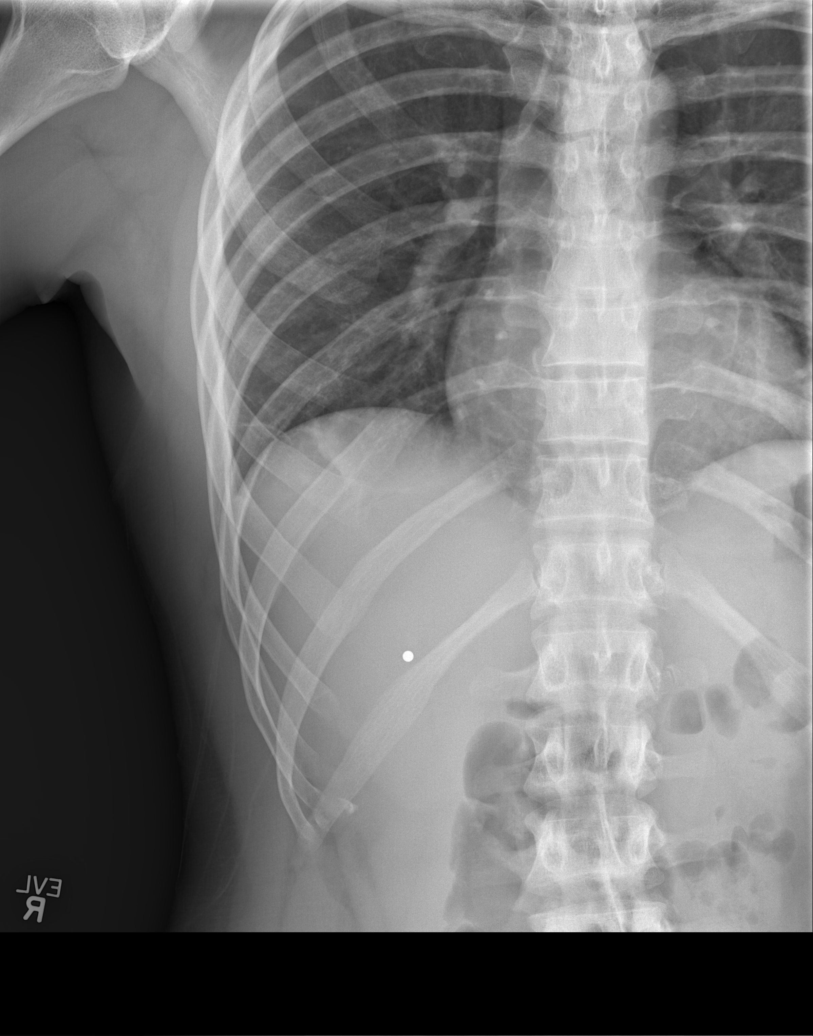

[w ribs obl right (1 of 2)]
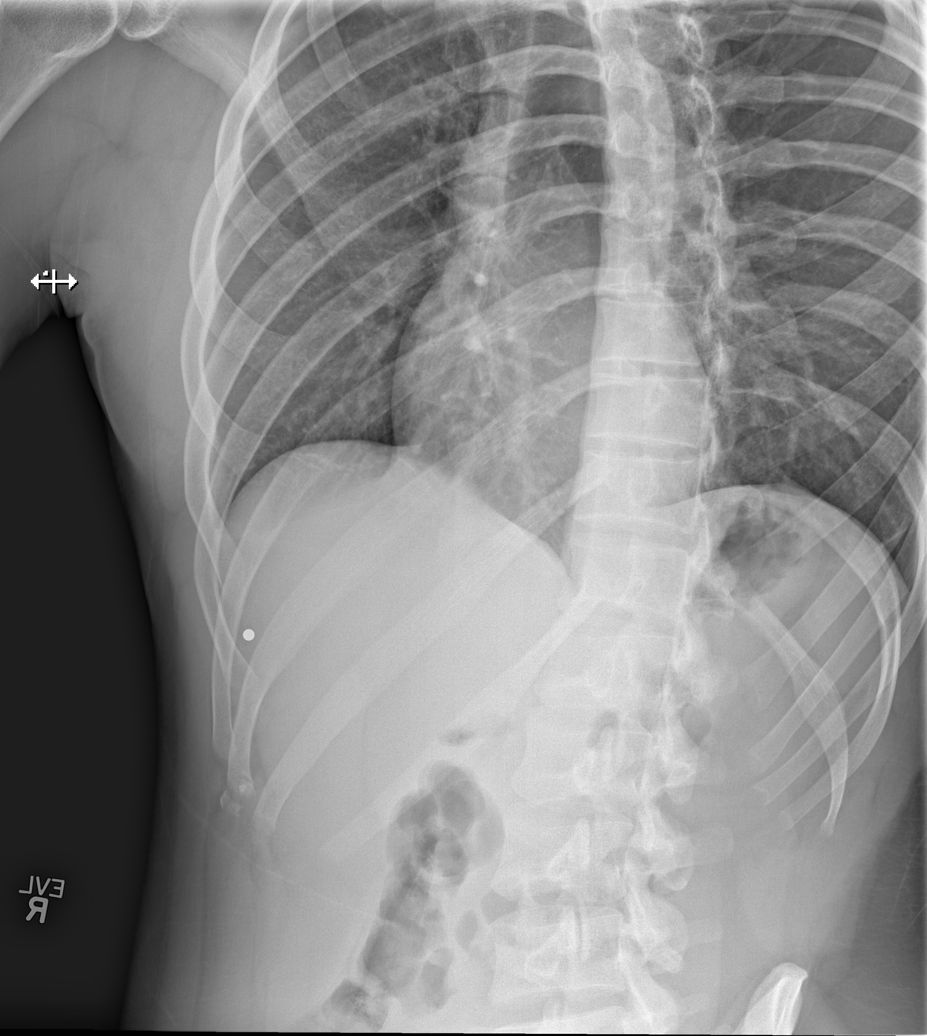

[w ribs obl right (2 of 2)]
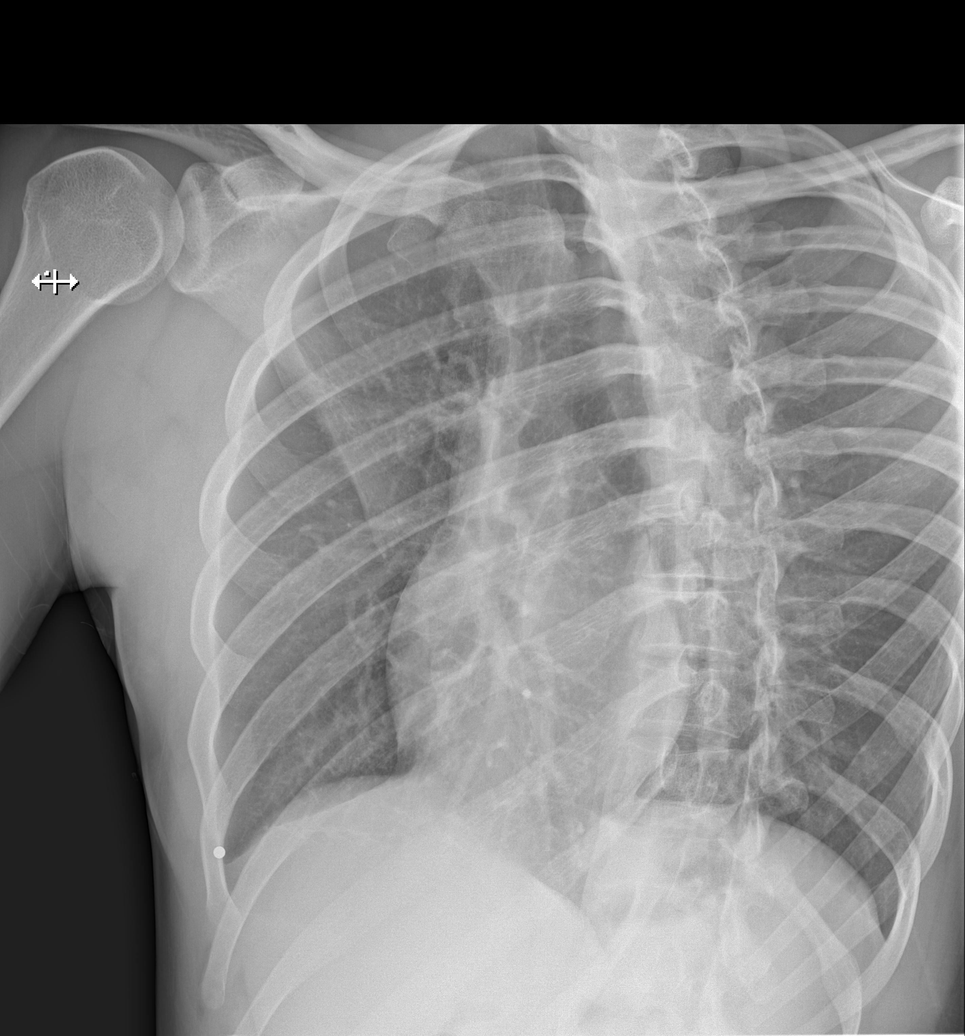

[5 of 5 positions shown; findings below may reference images not displayed]

FINDINGS: The heart size is normal. Ill-defined right lower lobe airspace
disease is present. There is no pneumothorax. No acute or healing
rib fracture is present.
IMPRESSION: 1. No acute or healing rib fracture.
2. Ill-defined right lower lobe airspace disease is most concerning
for pneumonia. Focal contusion or aspiration is considered less
likely.

## 2019-03-15 ENCOUNTER — Other Ambulatory Visit: Payer: Self-pay

## 2019-03-15 ENCOUNTER — Emergency Department (HOSPITAL_COMMUNITY)
Admission: EM | Admit: 2019-03-15 | Discharge: 2019-03-15 | Disposition: A | Discharge status: Home / Self Care | Payer: Self-pay | Attending: Emergency Medicine | Admitting: Emergency Medicine

## 2019-03-15 ENCOUNTER — Emergency Department (HOSPITAL_COMMUNITY): Payer: Self-pay

## 2019-03-15 ENCOUNTER — Encounter (HOSPITAL_COMMUNITY): Payer: Self-pay | Admitting: Emergency Medicine

## 2019-03-15 DIAGNOSIS — F17290 Nicotine dependence, other tobacco product, uncomplicated: Secondary | ICD-10-CM | POA: Insufficient documentation

## 2019-03-15 DIAGNOSIS — F17210 Nicotine dependence, cigarettes, uncomplicated: Secondary | ICD-10-CM | POA: Insufficient documentation

## 2019-03-15 DIAGNOSIS — Z23 Encounter for immunization: Secondary | ICD-10-CM | POA: Insufficient documentation

## 2019-03-15 DIAGNOSIS — S01111A Laceration without foreign body of right eyelid and periocular area, initial encounter: Secondary | ICD-10-CM | POA: Insufficient documentation

## 2019-03-15 DIAGNOSIS — S61210A Laceration without foreign body of right index finger without damage to nail, initial encounter: Secondary | ICD-10-CM | POA: Insufficient documentation

## 2019-03-15 DIAGNOSIS — Y9301 Activity, walking, marching and hiking: Secondary | ICD-10-CM | POA: Insufficient documentation

## 2019-03-15 DIAGNOSIS — Y92009 Unspecified place in unspecified non-institutional (private) residence as the place of occurrence of the external cause: Secondary | ICD-10-CM | POA: Insufficient documentation

## 2019-03-15 DIAGNOSIS — W108XXA Fall (on) (from) other stairs and steps, initial encounter: Secondary | ICD-10-CM | POA: Insufficient documentation

## 2019-03-15 DIAGNOSIS — Y999 Unspecified external cause status: Secondary | ICD-10-CM | POA: Insufficient documentation

## 2019-03-15 MED ORDER — LIDOCAINE HCL (PF) 1 % IJ SOLN
30.0000 mL | Freq: Once | INTRAMUSCULAR | Status: AC
  Administered 2019-03-15: 30 mL via INTRADERMAL
  Filled 2019-03-15: qty 30

## 2019-03-15 MED ORDER — TETANUS-DIPHTH-ACELL PERTUSSIS 5-2.5-18.5 LF-MCG/0.5 IM SUSP
0.5000 mL | Freq: Once | INTRAMUSCULAR | Status: AC
  Administered 2019-03-15: 0.5 mL via INTRAMUSCULAR
  Filled 2019-03-15: qty 0.5

## 2019-03-15 NOTE — ED Provider Notes (Signed)
[facility] EMERGENCY DEPARTMENT Provider Note   CSN: [id number] Arrival date & time: 03/15/19  2202     History No chief complaint on file.   Stephen Salinas is a 29 y.o. male.  The history is provided by the patient and medical records.    29 y.o. M here after a fall at home.  States he was walking up some concrete steps on his porch, lost his footing and fell, striking the right side of his face on the corner of the wall.  No LOC.  Sustained laceration to right eyebrow and right first digit.  Small abrasion to right thumb.  Denies any headache, dizziness, confusion, nausea, vomiting, difficulty walking, etc.  He is not on anticoagulation.  Does report history of right hand fracture in the past, still feels like it never healed.  Unsure of last tetanus, would like a booster.  History reviewed. No pertinent past medical history.  Patient Active Problem List   Diagnosis Date Noted  . 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 Difficulty controlling anger     Past Surgical History:  Procedure Laterality Date  . TOE SURGERY         Family History  Problem Relation Age of Onset  . Heart failure Mother   . Hypertension Father   . Hypertension Brother     Social History   Tobacco Use  . Smoking status: Current Every Day Smoker    Packs/day: 0.50    Types: Cigars, Cigarettes  Substance Use Topics  . Alcohol use: Yes    Comment: occ  . Drug use: Yes    Types: Marijuana    Comment: denies 01/28/2015    Home Medications Prior to Admission medications   Medication Sig Start Date End Date Taking? Authorizing Provider  ibuprofen (ADVIL,MOTRIN) 600 MG tablet Take 1 tablet (600 mg total) by mouth every 6 (six) hours as needed. 01/28/15   Kirichenko, Lahoma Rocker, PA-C  traMADol (ULTRAM) 50 MG tablet Take 1 tablet (50 mg total) by mouth every 6 (six) hours as needed. 01/28/15   Jeannett Senior, PA-C    Allergies      Patient has no known allergies.  Review of Systems   Review of Systems  Skin: Positive for wound.  All other systems reviewed and are negative.   Physical Exam Updated Vital Signs BP (!) 159/91 (BP Location: Left Arm)   Pulse (!) 101   Temp 98.3 F (36.8 C) (Oral)   Resp (!) 22   SpO2 98%   Physical Exam Vitals and nursing note reviewed.  Constitutional:      Appearance: He is well-developed.  HENT:     Head: Normocephalic and atraumatic.  Eyes:     Conjunctiva/sclera: Conjunctivae normal.     Pupils: Pupils are equal, round, and reactive to light.      Comments: 2inch laceration to right eyebrow, no active bleeding, no bony deformities of the orbital rim, no tenderness, bruising, or discoloration  Cardiovascular:     Rate and Rhythm: Normal rate and regular rhythm.     Heart sounds: Normal heart sounds.  Pulmonary:     Effort: Pulmonary effort is normal.     Breath sounds: Normal breath sounds.  Abdominal:     General: Bowel sounds are normal.     Palpations: Abdomen is soft.  Musculoskeletal:        General: Normal range of motion.     Cervical back: Normal range  of motion.     Comments: 1inch semicircular laceration to right first digit at the MCP joint, no bleeding, able to flex/extend digit as normal Small 1cm abrasion to right thumb at the IP joint, flexing/extending normally  Skin:    General: Skin is warm and dry.  Neurological:     Mental Status: He is alert and oriented to person, place, and time.     Comments: AAOx3, answering questions and following commands appropriately; equal strength UE and LE bilaterally; CN grossly intact; moves all extremities appropriately without ataxia; no focal neuro deficits or facial asymmetry appreciated     ED Results / Procedures / Treatments   Labs (all labs ordered are listed, but only abnormal results are displayed) Labs Reviewed - No data to display  EKG None  Radiology DG Hand Complete Right  Result Date:  03/15/2019 CLINICAL DATA:  Fall, laceration EXAM: RIGHT HAND - COMPLETE 3+ VIEW COMPARISON:  Report from study 08/16/2017 FINDINGS: Old transverse fracture through the mid right 3rd metacarpal. Fracture line remains evident compatible with nonunion or re-injury. No subluxation or dislocation. Soft tissues are intact. IMPRESSION: Transverse fracture through the right 3rd metacarpal as described on prior study from 2019. This is compatible with nonunion or re-injury. Electronically Signed   By: Charlett Nose M.D.   On: 03/15/2019 22:42    Procedures Procedures (including critical care time)  LACERATION REPAIR Performed by: Garlon Hatchet Authorized by: Garlon Hatchet Consent: Verbal consent obtained. Risks and benefits: risks, benefits and alternatives were discussed Consent given by: patient Patient identity confirmed: provided demographic data Prepped and Draped in normal sterile fashion Wound explored  Laceration Location: right eyebrow  Laceration Length: 5cm  No Foreign Bodies seen or palpated  Anesthesia: local infiltration  Local anesthetic: lidocaine 1% without epinephrine  Anesthetic total: 5 ml  Irrigation method: syringe Amount of cleaning: standard  Skin closure: 4-0 prolene  Number of sutures: 5  Technique: simple interrupted  Patient tolerance: Patient tolerated the procedure well with no immediate complications.  LACERATION REPAIR Performed by: Garlon Hatchet Authorized by: Garlon Hatchet Consent: Verbal consent obtained. Risks and benefits: risks, benefits and alternatives were discussed Consent given by: patient Patient identity confirmed: provided demographic data Prepped and Draped in normal sterile fashion Wound explored  Laceration Location: right 1st MCP joint  Laceration Length: 5cm  No Foreign Bodies seen or palpated  Anesthesia: local infiltration  Local anesthetic: lidocaine 1% without epinephrine  Anesthetic total: 4 ml  Irrigation  method: syringe Amount of cleaning: standard  Skin closure: 4-0 prolene  Number of sutures: 5  Technique: simple interrupted  Patient tolerance: Patient tolerated the procedure well with no immediate complications.    Medications Ordered in ED Medications  lidocaine (PF) (XYLOCAINE) 1 % injection 30 mL (has no administration in time range)  Tdap (BOOSTRIX) injection 0.5 mL (has no administration in time range)    ED Course  I have reviewed the triage vital signs and the nursing notes.  Pertinent labs & imaging results that were available during my care of the patient were reviewed by me and considered in my medical decision making (see chart for details).    MDM Rules/Calculators/A&P  29 year old male here after a fall while going upstairs.  Lost his footing, tripped, struck his head on concrete wall.  There was no loss of consciousness.  He sustained laceration to right eyebrow, right first MCP joint, and abrasion to right thumb.  He is awake, alert, appropriately oriented  here.  He is not on anticoagulation.  No signs of significant head trauma.  Neurologic exam is nonfocal.  Lacerations repaired as above, tolerated well.  X-ray with evidence of old fracture but no acute injuries.  Tetanus updated.  Discharged home with wound care instructions and follow-up with urgent care in 1 week for suture removal.  May return here for any new/acute changes.  Final Clinical Impression(s) / ED Diagnoses Final diagnoses:  Laceration of right eyebrow, initial encounter  Laceration of right index finger without foreign body without damage to nail, initial encounter    Rx / DC Orders ED Discharge Orders    None       Garlon Hatchet, PA-C 03/15/19 2329    Sabas Sous, MD 03/19/19 [phone]

## 2019-03-15 NOTE — ED Triage Notes (Addendum)
Pt reports he tripped and fell down 4 steps this evening, landed on corner of concrete, denies LOC, 1.5 inch lac above right eyebrow, 1 inch lac to right index finger knuckle, 0.5 inch lac to right thumb, bleeding controlled. GCS 15, no headache or vision changes, NAD. States he thinks his hand is still fractured from a previous injury 1 year ago.

## 2019-03-15 NOTE — Discharge Instructions (Signed)
Try to keep sutures clean and dry.  Can use topical neosporin if needed. Follow-up with urgent care in 7-10 days for suture removal. Once removed, can use topical mederma or similar to help reduce scarring if desired. Return here for any new/acute changes.

## 2020-10-20 IMAGING — DX DG HAND COMPLETE 3+V*R*
3 series · 3 of 3 positions shown · non-contrast
Comparison: Report from study 08/16/2017

CLINICAL DATA: Fall, laceration

EXAM:
RIGHT HAND - COMPLETE 3+ VIEW

[hand pa]
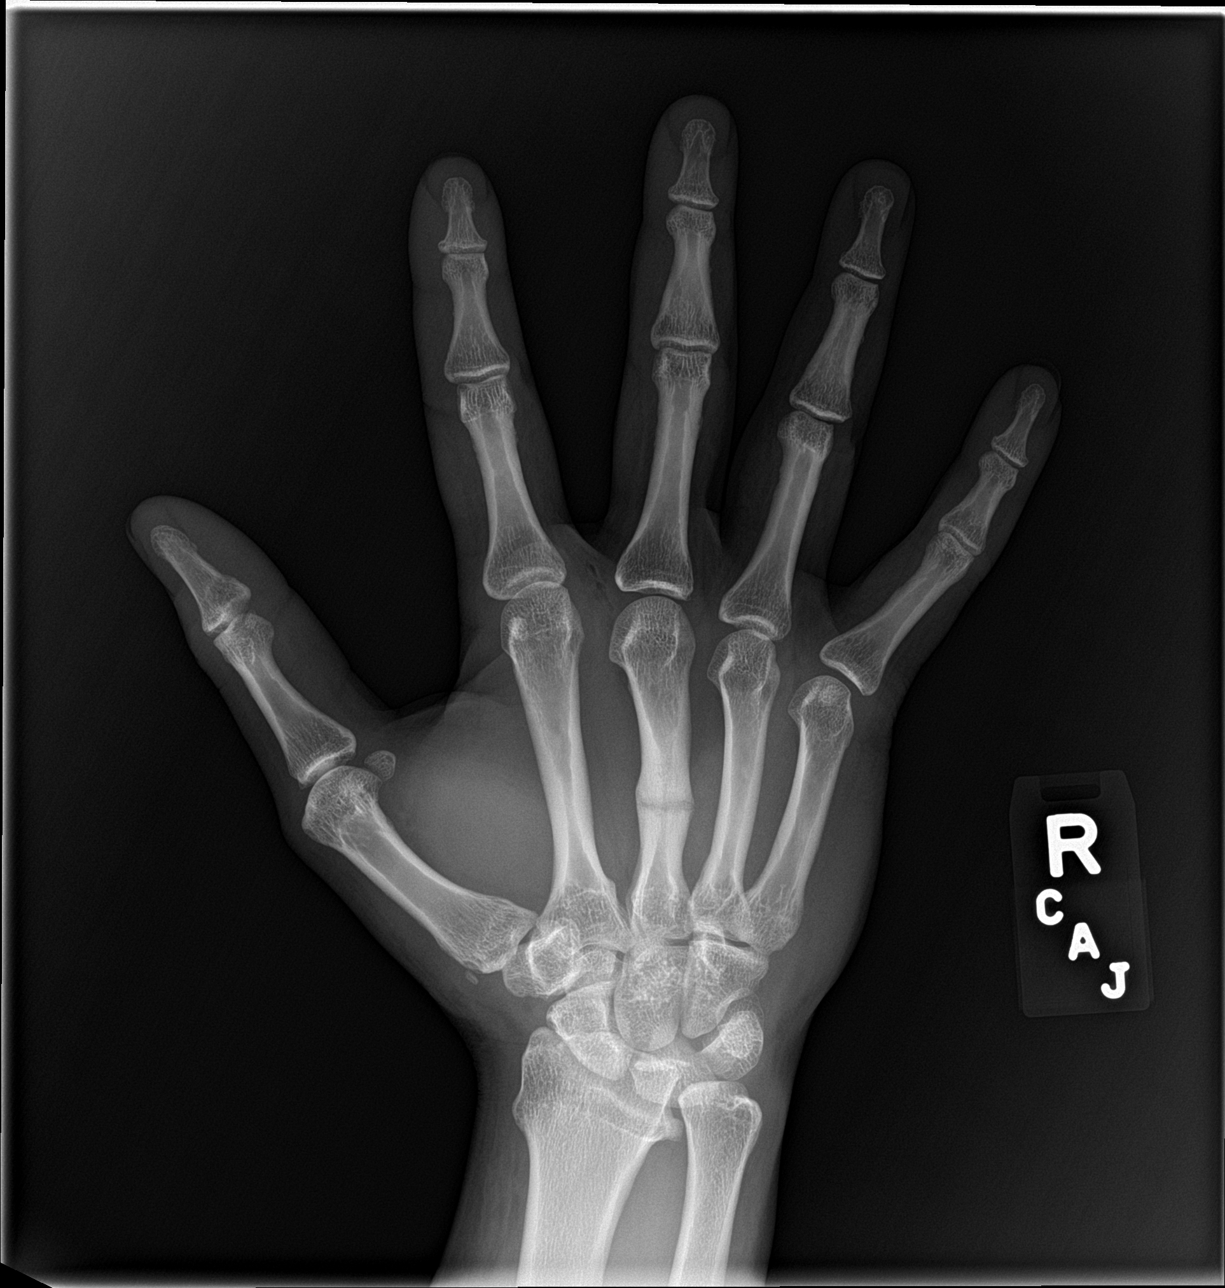

[hand obl]
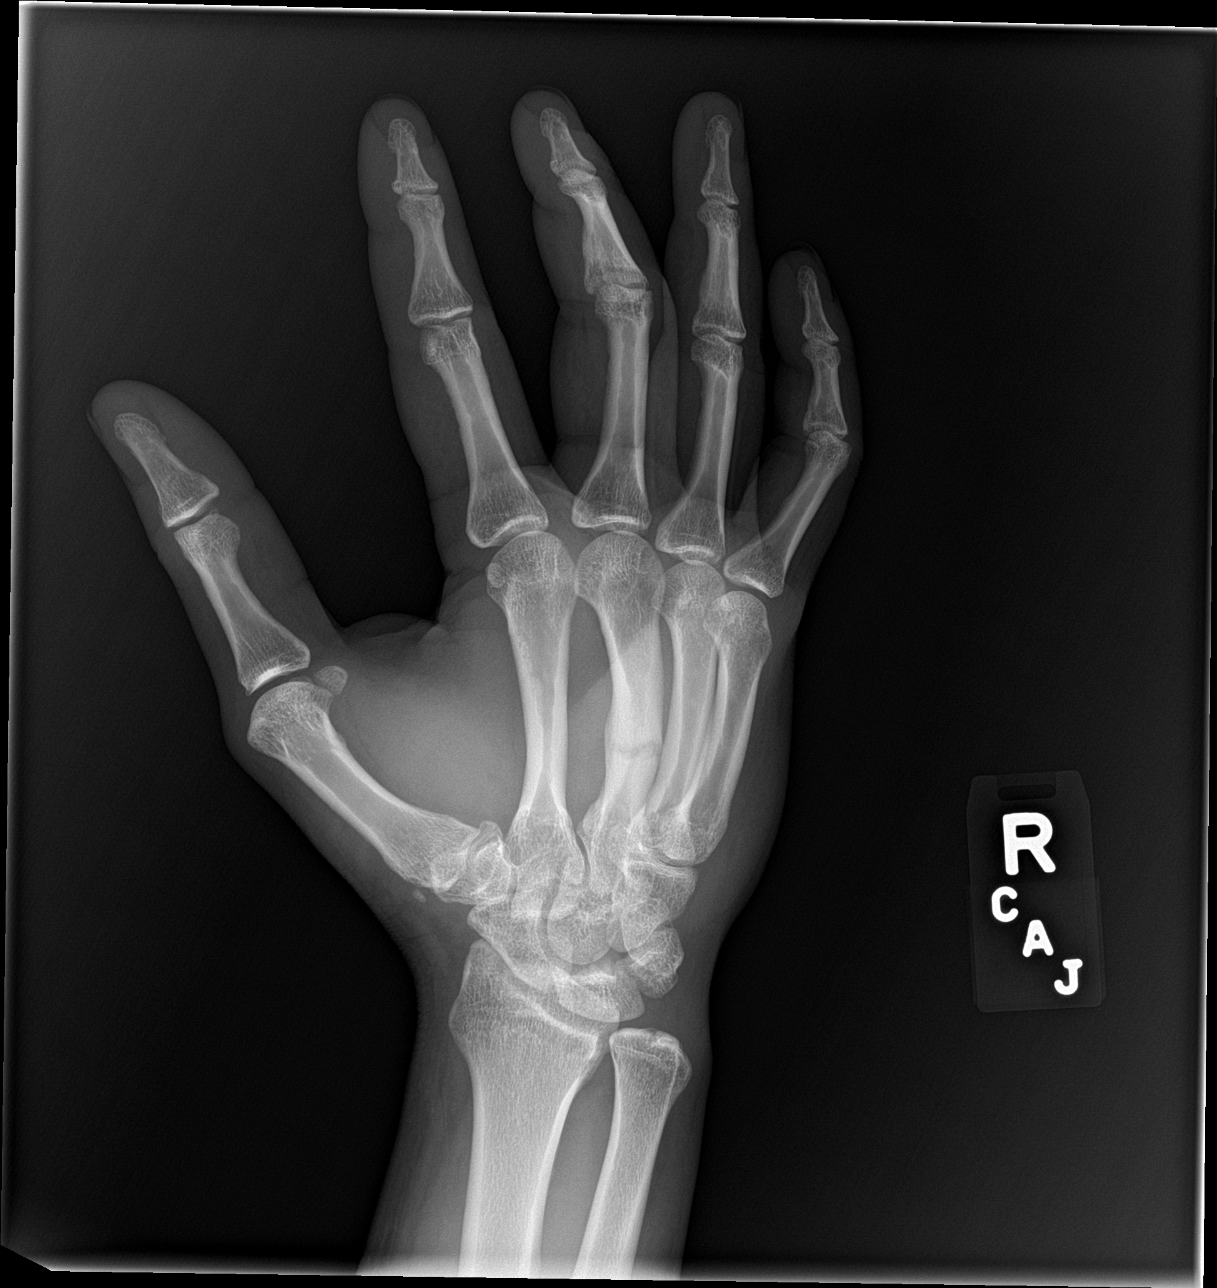

[hand lat]
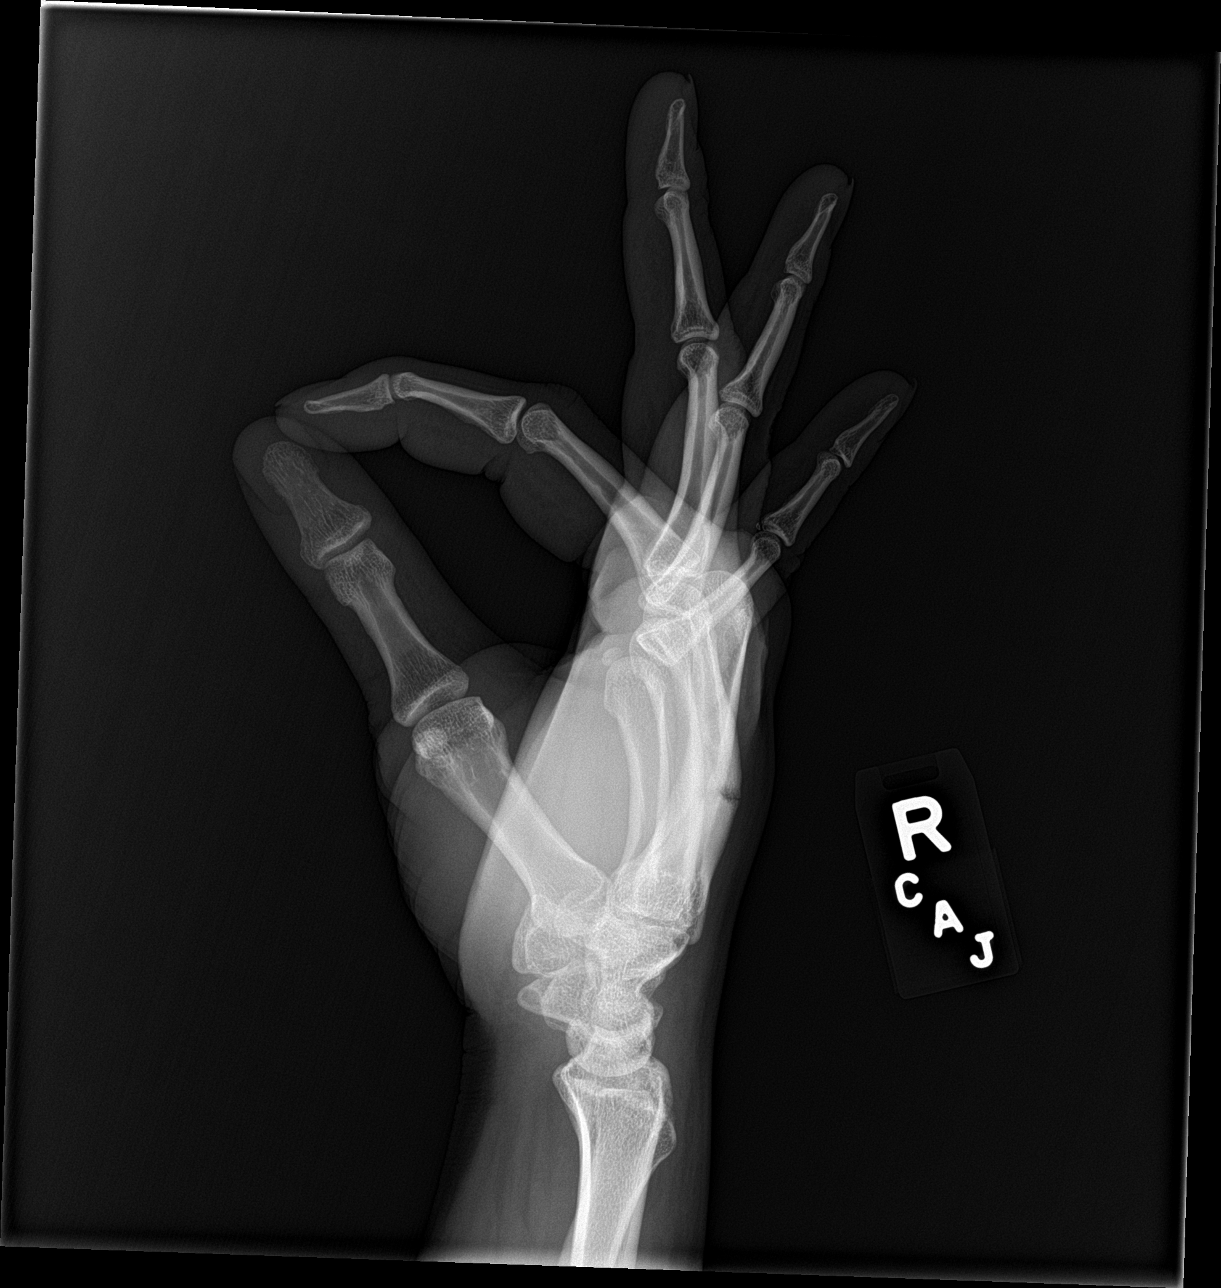

[3 of 3 positions shown; findings below may reference images not displayed]

FINDINGS: Old transverse fracture through the mid right 3rd metacarpal.
Fracture line remains evident compatible with nonunion or re-injury.
No subluxation or dislocation. Soft tissues are intact.
IMPRESSION: Transverse fracture through the right 3rd metacarpal as described on
prior study from 0232. This is compatible with nonunion or
re-injury.
